# Patient Record
Sex: Female | Born: 1975 | Race: White | Hispanic: No | Marital: Married | Smoking: Never smoker
Health system: Southern US, Community
[De-identification: ages and names within clinical notes are randomized; demographics above are authoritative.]

## PROBLEM LIST (undated history)

## (undated) DIAGNOSIS — E039 Hypothyroidism, unspecified: Secondary | ICD-10-CM

## (undated) DIAGNOSIS — M069 Rheumatoid arthritis, unspecified: Secondary | ICD-10-CM

## (undated) DIAGNOSIS — N200 Calculus of kidney: Secondary | ICD-10-CM

## (undated) HISTORY — PX: ABDOMINAL HYSTERECTOMY: SHX81

## (undated) HISTORY — PX: APPENDECTOMY: SHX54

## (undated) HISTORY — PX: OVARIAN CYST SURGERY: SHX726

---

## 2012-01-02 ENCOUNTER — Emergency Department: Payer: Self-pay | Admitting: Internal Medicine

## 2012-03-03 ENCOUNTER — Emergency Department: Payer: Self-pay | Admitting: Emergency Medicine

## 2012-07-24 ENCOUNTER — Emergency Department: Payer: Self-pay | Admitting: Emergency Medicine

## 2012-08-05 ENCOUNTER — Emergency Department (HOSPITAL_COMMUNITY)
Admission: EM | Admit: 2012-08-05 | Discharge: 2012-08-05 | Disposition: A | Discharge status: Home / Self Care | Payer: Self-pay | Attending: Emergency Medicine | Admitting: Emergency Medicine

## 2012-08-05 ENCOUNTER — Encounter (HOSPITAL_COMMUNITY): Payer: Self-pay

## 2012-08-05 DIAGNOSIS — E039 Hypothyroidism, unspecified: Secondary | ICD-10-CM | POA: Insufficient documentation

## 2012-08-05 DIAGNOSIS — M069 Rheumatoid arthritis, unspecified: Secondary | ICD-10-CM | POA: Insufficient documentation

## 2012-08-05 DIAGNOSIS — R109 Unspecified abdominal pain: Secondary | ICD-10-CM | POA: Insufficient documentation

## 2012-08-05 HISTORY — DX: Hypothyroidism, unspecified: E03.9

## 2012-08-05 HISTORY — DX: Rheumatoid arthritis, unspecified: M06.9

## 2012-08-05 LAB — BASIC METABOLIC PANEL
BUN: 8 mg/dL (ref 6–23)
Chloride: 104 mEq/L (ref 96–112)
GFR calc Af Amer: >90 mL/min (ref >90)
GFR calc non Af Amer: >90 mL/min (ref >90)
Glucose, Bld: 126 mg/dL — ABNORMAL HIGH (ref 70–99)
Potassium: 3.7 mEq/L (ref 3.5–5.1)
Sodium: 136 mEq/L (ref 135–145)

## 2012-08-05 LAB — URINE MICROSCOPIC-ADD ON

## 2012-08-05 LAB — CBC WITH DIFFERENTIAL/PLATELET
Basophils Relative: 0 % (ref 0–1)
Eosinophils Absolute: 0.3 10*3/uL (ref 0.0–0.7)
Hemoglobin: 13.5 g/dL (ref 12.0–15.0)
Lymphs Abs: 3 10*3/uL (ref 0.7–4.0)
MCH: 28 pg (ref 26.0–34.0)
Monocytes Relative: 5 % (ref 3–12)
Neutro Abs: 5.3 10*3/uL (ref 1.7–7.7)
Neutrophils Relative %: 59 % (ref 43–77)
Platelets: 325 10*3/uL (ref 150–400)
RBC: 4.83 MIL/uL (ref 3.87–5.11)
WBC: 9.1 10*3/uL (ref 4.0–10.5)

## 2012-08-05 LAB — WET PREP, GENITAL

## 2012-08-05 LAB — URINALYSIS, ROUTINE W REFLEX MICROSCOPIC
Nitrite: NEGATIVE
Specific Gravity, Urine: 1.025 (ref 1.005–1.030)
Urobilinogen, UA: 0.2 mg/dL (ref 0.0–1.0)
pH: 6 (ref 5.0–8.0)

## 2012-08-05 MED ORDER — NAPROXEN 500 MG PO TABS: 500.0000 mg | ORAL_TABLET | Freq: Two times a day (BID) | ORAL | Status: AC

## 2012-08-05 MED ORDER — OXYCODONE-ACETAMINOPHEN 5-325 MG PO TABS
1.0000 | ORAL_TABLET | ORAL | Status: AC | PRN
Start: 2012-08-05 — End: 2012-08-15

## 2012-08-05 MED ORDER — ONDANSETRON HCL 4 MG/2ML IJ SOLN
4.0000 mg | Freq: Once | INTRAMUSCULAR | Status: AC
  Administered 2012-08-05: 4 mg via INTRAVENOUS
  Filled 2012-08-05: qty 2

## 2012-08-05 MED ORDER — KETOROLAC TROMETHAMINE 30 MG/ML IJ SOLN
30.0000 mg | Freq: Once | INTRAMUSCULAR | Status: AC
  Administered 2012-08-05: 30 mg via INTRAVENOUS
  Filled 2012-08-05: qty 1

## 2012-08-05 MED ORDER — HYDROMORPHONE HCL PF 1 MG/ML IJ SOLN
1.0000 mg | Freq: Once | INTRAMUSCULAR | Status: AC
  Administered 2012-08-05: 1 mg via INTRAVENOUS

## 2012-08-05 MED ORDER — HYDROMORPHONE HCL PF 1 MG/ML IJ SOLN
1.0000 mg | Freq: Once | INTRAMUSCULAR | Status: AC
  Administered 2012-08-05: 1 mg via INTRAVENOUS
  Filled 2012-08-05: qty 1

## 2012-08-05 MED ORDER — HYDROMORPHONE HCL PF 1 MG/ML IJ SOLN
INTRAMUSCULAR | Status: AC
  Filled 2012-08-05: qty 1

## 2012-08-05 NOTE — ED Notes (Signed)
Ambulatory in department to restroom with steady gait, does not appear to be in any pain, no acute distress noted.

## 2012-08-05 NOTE — ED Notes (Signed)
Sitting quietly in bed, does not appear to be in acute distress, converses easily.

## 2012-08-05 NOTE — ED Notes (Signed)
Patient states that she is having right lower abdominal pain, states that she has had ovarian torsion in the past and this feels the same.  States she has had a hysterectomy but the right ovary remains.  States she has had multiple ovarian surgeries in the past.

## 2012-08-05 NOTE — ED Notes (Signed)
Pt c/o "sharp throbbing"  Pain to right lower abd pain and states she is also swollen in vaginal area

## 2012-08-05 NOTE — ED Notes (Signed)
When discussing ultrasound in am, pt states she is not returning to [facility], she is instead going to [facility], states "do not schedule the ultrasound."

## 2012-08-05 NOTE — ED Provider Notes (Signed)
History     CSN: [id number]  Arrival date & time 08/05/12  0030   First MD Initiated Contact with Patient 08/05/12 0044      Chief Complaint  Patient presents with  . Abdominal Pain    (Consider location/radiation/quality/duration/timing/severity/associated sxs/prior treatment) HPI Comments: Pt with hx of recurrent ovarian cysts in the past, presents with right lower cautery and abdominal pain which is located from the suprapubic area to the right side. This has been persistent for 2 days, sharp and throbbing in nature and associated with nausea. Worse with palpation and movement. There is no associated dysuria, diarrhea, change in bowel habits, vaginal bleeding or discharge. She does admit to having some swollen vagina. She recently finished a course of prednisone for rheumatoid arthritis swelling of her left hand which improved slightly.  She has no other history of abdominal surgery  Patient is a 36 y.o. female presenting with abdominal pain. The history is provided by the patient and the spouse.  Abdominal Pain The primary symptoms of the illness include abdominal pain and nausea. The primary symptoms of the illness do not include fever, fatigue, shortness of breath, vomiting, diarrhea, hematemesis, hematochezia, dysuria, vaginal discharge or vaginal bleeding.    Past Medical History  Diagnosis Date  . Hypothyroid   . Rheumatoid arthritis     Past Surgical History  Procedure Date  . Ovarian cyst surgery   . Abdominal hysterectomy     No family history on file.  History  Substance Use Topics  . Smoking status: Never Smoker   . Smokeless tobacco: Not on file  . Alcohol Use: No    OB History    Grav Para Term Preterm Abortions TAB SAB Ect Mult Living                  Review of Systems  Constitutional: Negative for fever and fatigue.  Respiratory: Negative for shortness of breath.   Gastrointestinal: Positive for nausea and abdominal pain. Negative for vomiting,  diarrhea, hematochezia and hematemesis.  Genitourinary: Negative for dysuria, vaginal bleeding and vaginal discharge.  All other systems reviewed and are negative.    Allergies  Lisinopril; Phenergan; and Sulfa antibiotics  Home Medications   Current Outpatient Rx  Name Route Sig Dispense Refill  . NAPROXEN 500 MG PO TABS Oral Take 1 tablet (500 mg total) by mouth 2 (two) times daily with a meal. 30 tablet 0  . OXYCODONE-ACETAMINOPHEN 5-325 MG PO TABS Oral Take 1 tablet by mouth every 4 (four) hours as needed for pain. 20 tablet 0    BP 136/91  Pulse 84  Temp 98.3 F (36.8 C) (Oral)  Resp 18  Physical Exam  Nursing note and vitals reviewed. Constitutional: She appears well-developed and well-nourished. No distress.  HENT:  Head: Normocephalic and atraumatic.  Mouth/Throat: Oropharynx is clear and moist. No oropharyngeal exudate.  Eyes: Conjunctivae and EOM are normal. Pupils are equal, round, and reactive to light. Right eye exhibits no discharge. Left eye exhibits no discharge. No scleral icterus.  Neck: Normal range of motion. Neck supple. No JVD present. No thyromegaly present.  Cardiovascular: Normal rate, regular rhythm, normal heart sounds and intact distal pulses.  Exam reveals no gallop and no friction rub.   No murmur heard. Pulmonary/Chest: Effort normal and breath sounds normal. No respiratory distress. She has no wheezes. She has no rales.  Abdominal: Soft. Bowel sounds are normal. She exhibits no distension and no mass. There is tenderness ( Suprapubic and right lower  quadrant tenderness without guarding. No peritoneal signs, no other tenderness, very soft abdomen).  Musculoskeletal: Normal range of motion. She exhibits no edema and no tenderness.  Lymphadenopathy:    She has no cervical adenopathy.  Neurological: She is alert. Coordination normal.  Skin: Skin is warm and dry. No rash noted. No erythema.  Psychiatric: She has a normal mood and affect. Her behavior  is normal.    ED Course  Procedures (including critical care time)  Labs Reviewed  URINALYSIS, ROUTINE W REFLEX MICROSCOPIC - Abnormal; Notable for the following:    Leukocytes, UA TRACE (*)     All other components within normal limits  BASIC METABOLIC PANEL - Abnormal; Notable for the following:    Glucose, Bld 126 (*)     All other components within normal limits  URINE MICROSCOPIC-ADD ON - Abnormal; Notable for the following:    Squamous Epithelial / LPF FEW (*)     All other components within normal limits  WET PREP, GENITAL - Abnormal; Notable for the following:    Clue Cells Wet Prep HPF POC FEW (*)     WBC, Wet Prep HPF POC FEW (*)     All other components within normal limits  CBC WITH DIFFERENTIAL  PREGNANCY, URINE  GC/CHLAMYDIA PROBE AMP, GENITAL   No results found.   1. Abdominal pain       MDM  I have reevaluated the patient, she has abdominal pain which is persistent to the right lower quadrant. I've explained to her that she does not have a leukocytosis but that she is at risk for appendicitis given the location of her pain. I have encouraged a CT scan but the patient has declined and prefers an ultrasound in the morning to evaluate for ovarian cyst. She will return in the morning for the study and will return to the hospital should her pain worsen. I have given her intravascular Toradol, intravascular hydromorphone and she has had some improvement in her pain.  Discharge Prescriptions include:  Naprosyn Percocet         Vida Roller, MD 08/05/12 [phone]

## 2012-11-13 ENCOUNTER — Emergency Department: Payer: Self-pay | Admitting: Emergency Medicine

## 2012-11-13 LAB — COMPREHENSIVE METABOLIC PANEL
Alkaline Phosphatase: 87 U/L (ref 50–136)
BUN: 11 mg/dL (ref 7–18)
Bilirubin,Total: 0.4 mg/dL (ref 0.2–1.0)
Creatinine: 0.79 mg/dL (ref 0.60–1.30)
EGFR (Non-African Amer.): >60
SGPT (ALT): 22 U/L (ref 12–78)
Total Protein: 7 g/dL (ref 6.4–8.2)

## 2012-11-13 LAB — URINALYSIS, COMPLETE
Bacteria: NONE SEEN
Bilirubin,UR: NEGATIVE
Blood: NEGATIVE
Ketone: NEGATIVE
Ph: 7 (ref 4.5–8.0)
Protein: NEGATIVE
RBC,UR: NONE SEEN /HPF (ref 0–5)
Specific Gravity: 1.02 (ref 1.003–1.030)
Squamous Epithelial: 6
WBC UR: 1 /HPF (ref 0–5)

## 2012-11-13 LAB — DRUG SCREEN, URINE
Barbiturates, Ur Screen: NEGATIVE (ref ?–200)
Cannabinoid 50 Ng, Ur ~~LOC~~: NEGATIVE (ref ?–50)
Cocaine Metabolite,Ur ~~LOC~~: NEGATIVE (ref ?–300)
MDMA (Ecstasy)Ur Screen: NEGATIVE (ref ?–500)
Opiate, Ur Screen: NEGATIVE (ref ?–300)
Phencyclidine (PCP) Ur S: NEGATIVE (ref ?–25)

## 2012-11-13 LAB — CBC
HGB: 13 g/dL (ref 12.0–16.0)
MCH: 28 pg (ref 26.0–34.0)
Platelet: 310 10*3/uL (ref 150–440)

## 2012-11-13 LAB — TSH: Thyroid Stimulating Horm: 3.11 u[IU]/mL

## 2012-11-13 LAB — ETHANOL: Ethanol %: <0.003 % (ref 0.000–0.080)

## 2012-12-03 ENCOUNTER — Inpatient Hospital Stay: Payer: Self-pay | Admitting: Internal Medicine

## 2012-12-03 LAB — URINALYSIS, COMPLETE
Bilirubin,UR: NEGATIVE
Blood: NEGATIVE
Glucose,UR: NEGATIVE mg/dL (ref 0–75)
Hyaline Cast: 3
Leukocyte Esterase: NEGATIVE
Ph: 5 (ref 4.5–8.0)
Protein: NEGATIVE
WBC UR: 3 /HPF (ref 0–5)

## 2012-12-03 LAB — CBC WITH DIFFERENTIAL/PLATELET
Basophil #: 0 10*3/uL (ref 0.0–0.1)
Lymphocyte #: 1.7 10*3/uL (ref 1.0–3.6)
Lymphocyte %: 21.4 %
MCHC: 33 g/dL (ref 32.0–36.0)
MCV: 84 fL (ref 80–100)
Monocyte #: 0.4 x10 3/mm (ref 0.2–0.9)
Platelet: 266 10*3/uL (ref 150–440)
RDW: 13.7 % (ref 11.5–14.5)
WBC: 7.9 10*3/uL (ref 3.6–11.0)

## 2012-12-03 LAB — DRUG SCREEN, URINE
Benzodiazepine, Ur Scrn: NEGATIVE (ref ?–200)
Cannabinoid 50 Ng, Ur ~~LOC~~: NEGATIVE (ref ?–50)
MDMA (Ecstasy)Ur Screen: NEGATIVE (ref ?–500)
Methadone, Ur Screen: NEGATIVE (ref ?–300)
Phencyclidine (PCP) Ur S: NEGATIVE (ref ?–25)

## 2012-12-03 LAB — CSF CELL COUNT WITH DIFFERENTIAL
CSF Tube #: 1
Eosinophil: 0 %
Eosinophil: 0 %
Lymphocytes: 80 %
Monocytes/Macrophages: 12 %
Monocytes/Macrophages: 26 %
Neutrophils: 0 %
Neutrophils: 8 %
RBC (CSF): 14 /mm3
WBC (CSF): 24 /mm3
WBC (CSF): 7 /mm3

## 2012-12-03 LAB — COMPREHENSIVE METABOLIC PANEL
Albumin: 3.6 g/dL (ref 3.4–5.0)
BUN: 11 mg/dL (ref 7–18)
Chloride: 108 mmol/L — ABNORMAL HIGH (ref 98–107)
Creatinine: 0.8 mg/dL (ref 0.60–1.30)
Glucose: 105 mg/dL — ABNORMAL HIGH (ref 65–99)
Potassium: 3.5 mmol/L (ref 3.5–5.1)
SGOT(AST): 22 U/L (ref 15–37)
SGPT (ALT): 27 U/L (ref 12–78)

## 2012-12-03 LAB — TROPONIN I: Troponin-I: <0.02 ng/mL

## 2012-12-03 LAB — TSH: Thyroid Stimulating Horm: 3.62 u[IU]/mL

## 2012-12-04 LAB — CBC WITH DIFFERENTIAL/PLATELET
Basophil %: 0.8 %
Eosinophil %: 2.3 %
HGB: 12.5 g/dL (ref 12.0–16.0)
Lymphocyte %: 25.5 %
Monocyte #: 0.4 x10 3/mm (ref 0.2–0.9)
Monocyte %: 5.8 %
Neutrophil %: 65.6 %
Platelet: 280 10*3/uL (ref 150–440)
RBC: 4.47 10*6/uL (ref 3.80–5.20)
WBC: 7.4 10*3/uL (ref 3.6–11.0)

## 2012-12-04 LAB — LIPID PANEL
Cholesterol: 160 mg/dL (ref 0–200)
HDL Cholesterol: 23 mg/dL — ABNORMAL LOW (ref 40–60)
VLDL Cholesterol, Calc: 30 mg/dL (ref 5–40)

## 2012-12-04 LAB — BASIC METABOLIC PANEL
Calcium, Total: 8.3 mg/dL — ABNORMAL LOW (ref 8.5–10.1)
Chloride: 110 mmol/L — ABNORMAL HIGH (ref 98–107)
EGFR (African American): >60
Glucose: 93 mg/dL (ref 65–99)
Osmolality: 280 (ref 275–301)
Potassium: 4.1 mmol/L (ref 3.5–5.1)

## 2012-12-06 LAB — CSF CULTURE W GRAM STAIN

## 2012-12-27 ENCOUNTER — Inpatient Hospital Stay: Payer: Self-pay | Admitting: Psychiatry

## 2012-12-27 LAB — COMPREHENSIVE METABOLIC PANEL
Albumin: 4 g/dL (ref 3.4–5.0)
Anion Gap: 6 — ABNORMAL LOW (ref 7–16)
Bilirubin,Total: 0.7 mg/dL (ref 0.2–1.0)
Calcium, Total: 8.9 mg/dL (ref 8.5–10.1)
Creatinine: 0.88 mg/dL (ref 0.60–1.30)
Osmolality: 275 (ref 275–301)
Potassium: 3.9 mmol/L (ref 3.5–5.1)
Sodium: 138 mmol/L (ref 136–145)
Total Protein: 7.4 g/dL (ref 6.4–8.2)

## 2012-12-27 LAB — CBC
MCV: 83 fL (ref 80–100)
Platelet: 326 10*3/uL (ref 150–440)
RBC: 4.95 10*6/uL (ref 3.80–5.20)
RDW: 13.8 % (ref 11.5–14.5)
WBC: 7.8 10*3/uL (ref 3.6–11.0)

## 2012-12-27 LAB — URINALYSIS, COMPLETE
Bacteria: NONE SEEN
Blood: NEGATIVE
Nitrite: NEGATIVE
Ph: 5 (ref 4.5–8.0)
Protein: NEGATIVE
Specific Gravity: 1.024 (ref 1.003–1.030)
WBC UR: 1 /HPF (ref 0–5)

## 2012-12-27 LAB — DRUG SCREEN, URINE
Barbiturates, Ur Screen: NEGATIVE (ref ?–200)
Cannabinoid 50 Ng, Ur ~~LOC~~: NEGATIVE (ref ?–50)
MDMA (Ecstasy)Ur Screen: NEGATIVE (ref ?–500)
Methadone, Ur Screen: NEGATIVE (ref ?–300)
Opiate, Ur Screen: NEGATIVE (ref ?–300)
Tricyclic, Ur Screen: NEGATIVE (ref ?–1000)

## 2012-12-27 LAB — SALICYLATE LEVEL: Salicylates, Serum: <1.7 mg/dL

## 2012-12-27 LAB — ETHANOL: Ethanol: <3 mg/dL

## 2013-02-01 ENCOUNTER — Emergency Department: Payer: Self-pay | Admitting: Emergency Medicine

## 2013-02-01 LAB — URINALYSIS, COMPLETE
Blood: NEGATIVE
Leukocyte Esterase: NEGATIVE
Ph: 6 (ref 4.5–8.0)
Specific Gravity: 1.035 (ref 1.003–1.030)
Squamous Epithelial: 2
WBC UR: NONE SEEN /HPF (ref 0–5)

## 2013-02-01 LAB — CBC
HCT: 44.4 % (ref 35.0–47.0)
HGB: 14.9 g/dL (ref 12.0–16.0)
MCH: 27.9 pg (ref 26.0–34.0)
MCHC: 33.6 g/dL (ref 32.0–36.0)
Platelet: 389 10*3/uL (ref 150–440)
RBC: 5.35 10*6/uL — ABNORMAL HIGH (ref 3.80–5.20)
WBC: 11.9 10*3/uL — ABNORMAL HIGH (ref 3.6–11.0)

## 2013-02-01 LAB — COMPREHENSIVE METABOLIC PANEL
Albumin: 4.4 g/dL (ref 3.4–5.0)
Alkaline Phosphatase: 102 U/L (ref 50–136)
Anion Gap: 8 (ref 7–16)
BUN: 12 mg/dL (ref 7–18)
Calcium, Total: 9.5 mg/dL (ref 8.5–10.1)
Co2: 25 mmol/L (ref 21–32)
Creatinine: 0.72 mg/dL (ref 0.60–1.30)
EGFR (African American): >60
EGFR (Non-African Amer.): >60
Glucose: 98 mg/dL (ref 65–99)
Osmolality: 275 (ref 275–301)
SGOT(AST): 13 U/L — ABNORMAL LOW (ref 15–37)
SGPT (ALT): 21 U/L (ref 12–78)
Total Protein: 8 g/dL (ref 6.4–8.2)

## 2013-02-01 LAB — DRUG SCREEN, URINE
Cannabinoid 50 Ng, Ur ~~LOC~~: NEGATIVE (ref ?–50)
Cocaine Metabolite,Ur ~~LOC~~: NEGATIVE (ref ?–300)
Opiate, Ur Screen: NEGATIVE (ref ?–300)
Tricyclic, Ur Screen: NEGATIVE (ref ?–1000)

## 2013-07-14 ENCOUNTER — Emergency Department: Payer: Self-pay | Admitting: Emergency Medicine

## 2013-07-15 LAB — BASIC METABOLIC PANEL
Anion Gap: 10 (ref 7–16)
BUN: 14 mg/dL (ref 7–18)
Chloride: 104 mmol/L (ref 98–107)
Co2: 24 mmol/L (ref 21–32)
Creatinine: 1.13 mg/dL (ref 0.60–1.30)
EGFR (African American): >60
Glucose: 98 mg/dL (ref 65–99)
Osmolality: 276 (ref 275–301)
Potassium: 3.6 mmol/L (ref 3.5–5.1)
Sodium: 138 mmol/L (ref 136–145)

## 2013-07-15 LAB — URINALYSIS, COMPLETE
Bacteria: NONE SEEN
Bilirubin,UR: NEGATIVE
Blood: NEGATIVE
Glucose,UR: NEGATIVE mg/dL (ref 0–75)
Leukocyte Esterase: NEGATIVE
Nitrite: NEGATIVE
Ph: 7 (ref 4.5–8.0)
RBC,UR: NONE SEEN /HPF (ref 0–5)
Specific Gravity: 1.013 (ref 1.003–1.030)
Squamous Epithelial: NONE SEEN
WBC UR: <1 /HPF (ref 0–5)

## 2013-07-15 LAB — CBC
HGB: 13.1 g/dL (ref 12.0–16.0)
MCH: 28.1 pg (ref 26.0–34.0)
MCHC: 34.6 g/dL (ref 32.0–36.0)
Platelet: 366 10*3/uL (ref 150–440)
RBC: 4.66 10*6/uL (ref 3.80–5.20)
RDW: 13.6 % (ref 11.5–14.5)

## 2013-07-19 DIAGNOSIS — E039 Hypothyroidism, unspecified: Secondary | ICD-10-CM | POA: Diagnosis present

## 2013-09-22 ENCOUNTER — Emergency Department: Payer: Self-pay | Admitting: Emergency Medicine

## 2013-12-21 ENCOUNTER — Emergency Department: Payer: Self-pay | Admitting: Emergency Medicine

## 2013-12-21 LAB — URINALYSIS, COMPLETE
Bilirubin,UR: NEGATIVE
Blood: NEGATIVE
Glucose,UR: NEGATIVE mg/dL (ref 0–75)
Ketone: NEGATIVE
Nitrite: NEGATIVE
Ph: 5 (ref 4.5–8.0)
RBC,UR: 1 /HPF (ref 0–5)
Specific Gravity: 1.024 (ref 1.003–1.030)
WBC UR: 1 /HPF (ref 0–5)

## 2013-12-21 LAB — BASIC METABOLIC PANEL
Calcium, Total: 9.1 mg/dL (ref 8.5–10.1)
Chloride: 108 mmol/L — ABNORMAL HIGH (ref 98–107)
Co2: 23 mmol/L (ref 21–32)
Creatinine: 0.82 mg/dL (ref 0.60–1.30)
EGFR (African American): >60
EGFR (Non-African Amer.): >60
Potassium: 3.9 mmol/L (ref 3.5–5.1)

## 2013-12-30 IMAGING — CR LEFT MIDDLE FINGER 2+V
1 series · 3 of 3 positions shown · non-contrast
Comparison: none

REASON FOR EXAM: pain RA
COMMENTS:

[Series 1: x finger pa left · 0.14mm/px · 3 of 3 slices shown]
[im 1/3]
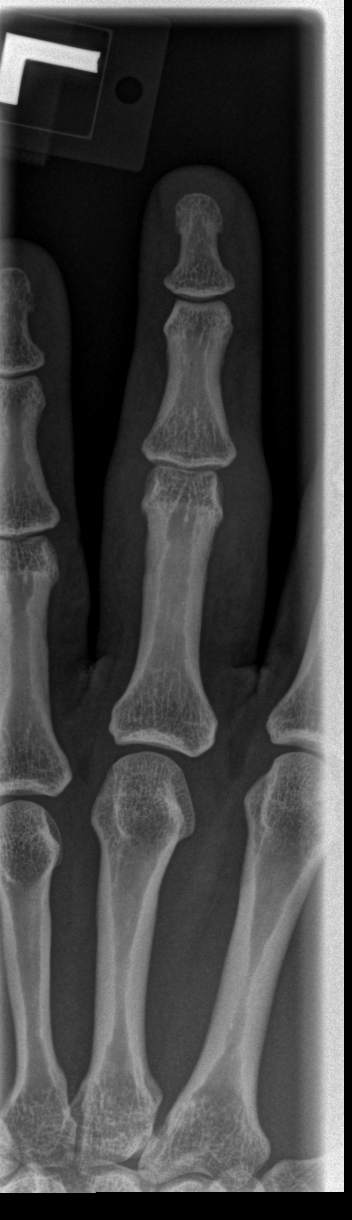
[im 2/3]
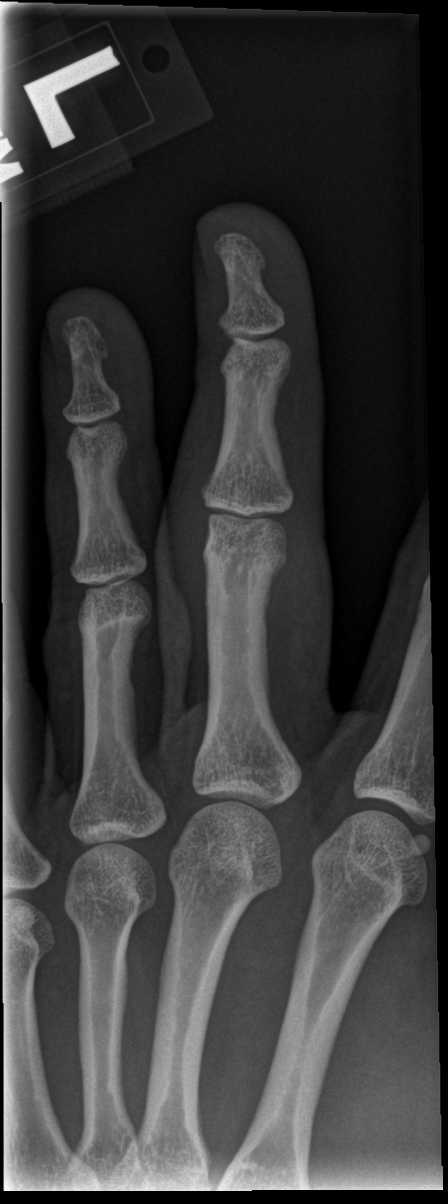
[im 3/3]
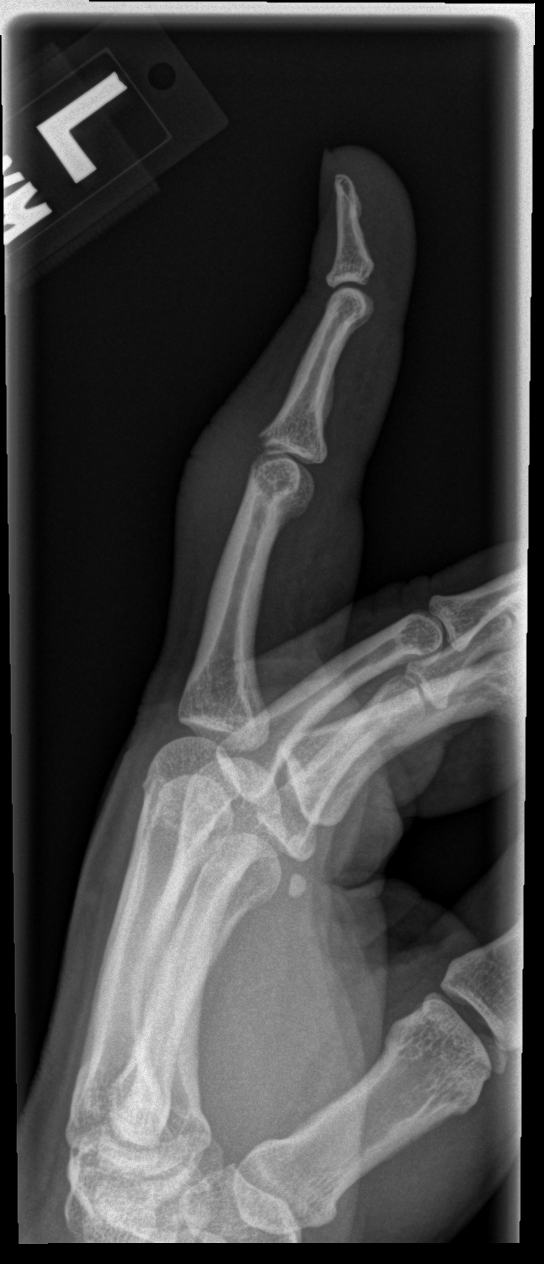

[3 of 3 positions shown; findings below may reference images not displayed]

PROCEDURE:     DXR - DXR FINGER MID 3RD DIGIT LT HAND  - January 02, 2012  [DATE]

RESULT:     And the lateral view there is deformity of the posterior dorsal
aspect of the middle phalanx at the PIP joint. The findings are suspicious
for a fracture of the proximal portion of the middle phalanx. There is
associated soft tissue swelling in this region. The finding however is seen
in only one view and the detached fragment is not evident. Conceivably this
could represent a superiors finding.
IMPRESSION: 1. Probable minimally displaced fracture of the proximal dorsal aspect of
the middle phalanx at the DIP joint. As noted above the finding is seen in
only one view.

## 2014-02-09 ENCOUNTER — Emergency Department: Payer: Self-pay | Admitting: Emergency Medicine

## 2014-02-12 LAB — BETA STREP CULTURE(ARMC)

## 2014-05-05 ENCOUNTER — Emergency Department: Payer: Self-pay | Admitting: Emergency Medicine

## 2014-05-05 LAB — URINALYSIS, COMPLETE
BACTERIA: NONE SEEN
Bilirubin,UR: NEGATIVE
Blood: NEGATIVE
Glucose,UR: NEGATIVE mg/dL (ref 0–75)
KETONE: NEGATIVE
Nitrite: NEGATIVE
Ph: 5 (ref 4.5–8.0)
Protein: NEGATIVE
Specific Gravity: 1.025 (ref 1.003–1.030)
WBC UR: 1 /HPF (ref 0–5)

## 2014-05-05 LAB — CBC
HCT: 40.2 % (ref 35.0–47.0)
HGB: 13.7 g/dL (ref 12.0–16.0)
MCH: 28.3 pg (ref 26.0–34.0)
MCHC: 33.9 g/dL (ref 32.0–36.0)
MCV: 83 fL (ref 80–100)
Platelet: 286 10*3/uL (ref 150–440)
RBC: 4.82 10*6/uL (ref 3.80–5.20)
RDW: 14.1 % (ref 11.5–14.5)
WBC: 8 10*3/uL (ref 3.6–11.0)

## 2014-05-05 LAB — COMPREHENSIVE METABOLIC PANEL
ALK PHOS: 80 U/L
Albumin: 3.4 g/dL (ref 3.4–5.0)
Anion Gap: 5 — ABNORMAL LOW (ref 7–16)
BILIRUBIN TOTAL: 0.6 mg/dL (ref 0.2–1.0)
BUN: 10 mg/dL (ref 7–18)
CALCIUM: 8.9 mg/dL (ref 8.5–10.1)
CREATININE: 0.63 mg/dL (ref 0.60–1.30)
Chloride: 108 mmol/L — ABNORMAL HIGH (ref 98–107)
Co2: 23 mmol/L (ref 21–32)
EGFR (African American): >60
EGFR (Non-African Amer.): >60
GLUCOSE: 125 mg/dL — AB (ref 65–99)
Osmolality: 272 (ref 275–301)
Potassium: 4 mmol/L (ref 3.5–5.1)
SGOT(AST): 20 U/L (ref 15–37)
SGPT (ALT): 27 U/L (ref 12–78)
Sodium: 136 mmol/L (ref 136–145)
TOTAL PROTEIN: 7.2 g/dL (ref 6.4–8.2)

## 2014-09-22 ENCOUNTER — Emergency Department: Payer: Self-pay | Admitting: Emergency Medicine

## 2014-09-22 LAB — CBC
HCT: 41.8 % (ref 35.0–47.0)
HGB: 13.9 g/dL (ref 12.0–16.0)
MCH: 27.4 pg (ref 26.0–34.0)
MCHC: 33.2 g/dL (ref 32.0–36.0)
MCV: 82 fL (ref 80–100)
PLATELETS: 292 10*3/uL (ref 150–440)
RBC: 5.07 10*6/uL (ref 3.80–5.20)
RDW: 13.9 % (ref 11.5–14.5)
WBC: 7.4 10*3/uL (ref 3.6–11.0)

## 2014-09-22 LAB — COMPREHENSIVE METABOLIC PANEL
ALK PHOS: 72 U/L
Albumin: 3.4 g/dL (ref 3.4–5.0)
Anion Gap: 5 — ABNORMAL LOW (ref 7–16)
BILIRUBIN TOTAL: 1 mg/dL (ref 0.2–1.0)
BUN: 8 mg/dL (ref 7–18)
CHLORIDE: 108 mmol/L — AB (ref 98–107)
Calcium, Total: 8.5 mg/dL (ref 8.5–10.1)
Co2: 26 mmol/L (ref 21–32)
Creatinine: 0.84 mg/dL (ref 0.60–1.30)
EGFR (African American): >60
GLUCOSE: 114 mg/dL — AB (ref 65–99)
OSMOLALITY: 277 (ref 275–301)
POTASSIUM: 3.6 mmol/L (ref 3.5–5.1)
SGOT(AST): 18 U/L (ref 15–37)
SGPT (ALT): 25 U/L
SODIUM: 139 mmol/L (ref 136–145)
Total Protein: 7.1 g/dL (ref 6.4–8.2)

## 2014-09-22 LAB — TROPONIN I: Troponin-I: <0.02 ng/mL

## 2014-12-24 ENCOUNTER — Emergency Department: Payer: Self-pay | Admitting: Emergency Medicine

## 2014-12-24 LAB — BASIC METABOLIC PANEL
Anion Gap: 10 (ref 7–16)
BUN: 10 mg/dL (ref 7–18)
CO2: 21 mmol/L (ref 21–32)
Calcium, Total: 8.6 mg/dL (ref 8.5–10.1)
Chloride: 110 mmol/L — ABNORMAL HIGH (ref 98–107)
Creatinine: 0.93 mg/dL (ref 0.60–1.30)
EGFR (African American): >60
EGFR (Non-African Amer.): >60
Glucose: 106 mg/dL — ABNORMAL HIGH (ref 65–99)
OSMOLALITY: 281 (ref 275–301)
Potassium: 3.5 mmol/L (ref 3.5–5.1)
SODIUM: 141 mmol/L (ref 136–145)

## 2014-12-24 LAB — CBC
HCT: 40.1 % (ref 35.0–47.0)
HGB: 13.1 g/dL (ref 12.0–16.0)
MCH: 27.1 pg (ref 26.0–34.0)
MCHC: 32.8 g/dL (ref 32.0–36.0)
MCV: 83 fL (ref 80–100)
Platelet: 312 10*3/uL (ref 150–440)
RBC: 4.86 10*6/uL (ref 3.80–5.20)
RDW: 13.8 % (ref 11.5–14.5)
WBC: 8 10*3/uL (ref 3.6–11.0)

## 2014-12-24 LAB — TROPONIN I

## 2014-12-25 ENCOUNTER — Emergency Department: Payer: Self-pay | Admitting: Emergency Medicine

## 2014-12-26 LAB — URINALYSIS, COMPLETE
BILIRUBIN, UR: NEGATIVE
Blood: NEGATIVE
Glucose,UR: NEGATIVE mg/dL (ref 0–75)
Ketone: NEGATIVE
LEUKOCYTE ESTERASE: NEGATIVE
NITRITE: NEGATIVE
Ph: 5 (ref 4.5–8.0)
Protein: NEGATIVE
RBC,UR: 1 /HPF (ref 0–5)
SPECIFIC GRAVITY: 1.026 (ref 1.003–1.030)
Squamous Epithelial: 11

## 2015-04-18 NOTE — H&P (Signed)
PATIENT NAME:  Nichole Fuller, Nichole Fuller MR#:  [id number] DATE OF BIRTH:  [birth year]  DATE OF ADMISSION:  12/03/2012   PRIMARY CARE PHYSICIAN: [place]   HISTORY OF PRESENTING ILLNESS: This is a 39 year old female with past medical history of hypothyroidism and ovarian cyst who came to the Emergency Room because she was working today at her workplace. She works at [employer]. She suddenly felt very dizzy and weak and she told the person standing nearby that she was not feeling well and then she suddenly passed out. The next thing she noticed was she was lying on the floor and people around her but she does not remember anybody mentioning she had any seizures any time while she passed out. After regaining her consciousness now she doesn't feel any weakness focally in any limbs. She is having generalized weakness overall. She said that since the last 3 to 4 days she had headaches coming on and off and she had some memory problems also for the last 2 to 3 days. She had low-grade fever also associated with it. Denies any nausea, vomiting, any change in vision, or any change in hearing. She has a history of auditory hallucinations and she said that she had worsening of her hallucinations recently and she went to her doctor and they changed the medication and after that she is feeling fine now.   REVIEW OF SYSTEMS: Positive for weakness. Negative for any weight loss or weight gain. EYES: Denies any blurring or double lesion, any pain or redness or discharge from the eyes. ENT: Denies any tinnitus, ear pain, hearing loss, or any discharge from the ears or postnasal drip. RESPIRATORY: Denies any cough, wheezing, hemoptysis, or any respiratory problem. CARDIOVASCULAR: Denies any chest pain, orthopnea, palpitation, or arrhythmia. GASTROINTESTINAL: Denies any nausea, vomiting, diarrhea, abdominal pain, or change in bowel habits. GENITOURINARY: Denies dysuria, hematuria, frequency, or incontinence of the urine. ENDOCRINE:  Denies polyuria or nocturia. Denies any heat or cold intolerance. SKIN: Denies any rashes or lesions in the skin. MUSCULOSKELETAL: Denies any pain or swelling in the joints. NEUROLOGICAL: Denies any numbness, any focal weakness, tremors, seizures. PSYCHIATRIC: Denies any anxiety, insomnia but she said that she has auditory hallucinations and she is being treated for that and it is under control right now.   PAST MEDICAL HISTORY: 1. Auditory hallucinations. 2. Hypothyroidism. 3. Ovarian cyst and chronic pain because of that.   PAST SURGICAL HISTORY:  1. Left-sided oophorectomy. 2. Right-sided ovarian cystectomy.  ALLERGIES: Allergic to lisinopril, Phenergan, and sulfa drugs.   SOCIAL HISTORY: Denies smoking, drugs, or alcohol use. Works at [employer].   FAMILY HISTORY:  Positive for cancer in multiple family members. Diabetes in father.   PHYSICAL EXAMINATION:   VITAL SIGNS: Temperature 97.9, pulse rate 91, respirations 18, blood pressure 109/64, saturation 96 on room air.   GENERAL: She is fully alert, oriented to time, place, and person and right now cooperative with history taking and physical examination.   HEENT: Atraumatic. Conjunctivae pink. Oral mucosa moist. Hearing grossly intact.   NECK: Supple. No neck rigidity. No JVD.   RESPIRATORY: Bilateral clear and equal air entry. No crackles or rhonchi heard.   CARDIOVASCULAR: S1, S2 present, regular. No murmur appreciated.   ABDOMEN: Soft, nontender. Bowel sounds present. No organomegaly appreciated. Obesity present.   EXTREMITIES: Legs bilateral no edema.   SKIN: No rashes.   NEUROLOGICAL: No tremor. No rigidity. Grossly intact. No cranial nerve deficit appreciated. Power 5 out of 5 in all four limbs.  LABORATORY, DIAGNOSTIC, AND RADIOLOGICAL DATA: Glucose 105, BUN 11, creatinine 0.8, sodium 138, potassium 3.5, chloride 108, CO2 24, calcium 8.4, total protein 6.8, albumin 3.6, bilirubin 0.7, alkaline phosphatase 91, SGOT  22, SGPT 27. Troponin less than 0.02. TSH 3.62. Urine for drugs is negative. Total WBC 7.9, RBC 4.7, hemoglobin 12.9, platelet count 266, MCV 84. CSF analysis clarity clear, RBC 0, WBC in one tube 7, the other 24, neutrophils 0, lymphocyte 80 in one tube and 74 in the other tube. Microbiology culture CSF no organisms seen. Urinalysis grossly negative.   CT of the head negative.   ASSESSMENT:  1. Meningitis, possibly viral or encephalitis. Will give Rocephin IV and keep her on contact isolation at this time. Will call ID consult. Will give Rocephin and acyclovir IV until the cultures are negative or ID wants to discontinue the coverage for bacterial meningitis also.  2. Hypothyroidism. I will give her levothyroxine. Continue the same dose as she is taking at home. 3. DVT and GI prophylaxis. 4. Insomnia. I will give her Benadryl to help her to sleep as she is taking at home.  5. Auditory hallucinations. I will continue her home medication, Risperdal and citalopram.   TOTAL TIME SPENT: 50 minutes.    CODE STATUS: FULL CODE.   ____________________________ Hope PigeonVaibhavkumar G. Elisabeth PigeonVachhani, MD vgv:drc D: 12/03/2012 21:12:43 ET T: 12/04/2012 06:25:37 ET JOB#: [id number]  cc: Hope PigeonVaibhavkumar G. Elisabeth PigeonVachhani, MD, <Dictator> Altamese DillingVAIBHAVKUMAR Jabe Jeanbaptiste MD ELECTRONICALLY SIGNED 12/14/2012 22:57

## 2015-04-18 NOTE — Consult Note (Signed)
PATIENT NAME:  Nichole Fuller, Merissa M MR#:  [id number] DATE OF BIRTH:  10/11/1976  DATE OF CONSULTATION:  11/14/2012  REFERRING PHYSICIAN:   CONSULTING PHYSICIAN:  Aamina Skiff K. Daira Hine, MD  SUBJECTIVE:  The patient is a 39 year old white female employed as a Conservation officer, naturecashier at [employer], and held the job since 03/2012.  The patient is married for the second time for one year and lives with her husband.  The patient comes to [facility] by the Emergency Room with the chief complaint of "hearing voices, which are getting worse and last night they got worse.  These voices are muffled and do not make any sense, but bother me  a lot because they are constantly there."  HISTORY OF PRESENT ILLNESS:  The patient reports that three weeks ago she started hearing voices that are muffled that she cannot identify, and she does not know what they are saying but this had been getting worse by last night.  She told her husband and she thought she should come here for help.    PAST PSYCHIATRIC HISTORY:  No previous history of inpatient hospitalization.  No history of suicide attempts.  Was given Lexapro when she was depressed after her first divorce and it helped her for a while.  She took counseling but quit as she felt better.  The patient reports lots of stresses in her life which include her two children from her first marriage live with their father and he does not let her see the children except once in one and a half years, and he makes her pay child support.  All these stressors added up for her to get depressed and she started hearing voices.  ALCOHOL AND DRUGS:  Denied.  MENTAL STATUS EXAMINATION:  The patient is dressed in hospital clothes, alert and oriented to place, person, and time.  Fully aware of the situation that brought her for admission to [facility] via the Emergency Room.  She knew the day, date, time, place, person, and situation.  Cognition was intact.   Affect is appropriate with mood, which is low and down, and depressed about the situation with her children and life in general.  Admits feeling hopeless and helpless at times. Denies any suicidal or homicidal ideas or plans.  Admits to hearing voices which are muffled which are being helped with a low dose of Haldol that was started last night.  Voices are not as bad and not as loud and she is able to rest better.  General knowledge information is fair.  She could spell the word "world" forward and backward without any problems.  Denies any ideas to hurt herself or others and contracts for safety.  Insight and judgment guarded.  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  PLAN:  Recommend discharge as the patient wants to go home and she contracts for safety and she is able to go home and get follow-up appointment with a psychiatrist and mental health agency on an outpatient basis.  Recommend that the patient should continue Celexa 40 mg p.o. daily and Haldol 1 mg p.o. b.i.d. to help her with her auditory hallucinations that have been bothering her.  The patient is recommended to make an appointment in the community for followup for counseling and medication management and she agrees to the same.    ____________________________ Jannet MantisSurya K. Guss Bundehalla, MD skc:bjt D: 11/14/2012 18:57:47 ET T: 11/15/2012 12:39:06 ET JOB#: [id number]  cc: Monika SalkSurya K. Guss Bundehalla, MD, <Dictator>  Beau Fanny MD ELECTRONICALLY SIGNED 11/15/2012 15:00

## 2015-04-18 NOTE — Consult Note (Signed)
PATIENT NAME:  Nichole Fuller, WOLLEN MR#:  [id number] DATE OF BIRTH:  1976/12/15  DATE OF CONSULTATION:  12/04/2012  REFERRING PHYSICIAN: Hope Pigeon. Elisabeth Pigeon, MD  CONSULTING PHYSICIAN: Rosalyn Gess. Mashell Sieben, M.D.   REASON FOR CONSULTATION: Meningitis.   HISTORY OF PRESENT ILLNESS: The patient is a 39 year old female with a past history significant for rheumatoid arthritis and recent psychosis diagnosis who was admitted yesterday with an approximately two week history of headaches and neck stiffness. She initially noted her headache in the frontal aspect bilaterally. She had some associated upper respiratory infection symptoms and attributed this all to that. She was at work and began feeling weak and dizzy. She then had a syncopal episode and was out for a few seconds to minutes. She had no witnessed seizure activity. She has had some fevers and chills associated with her headache. She was  recently diagnosed with psychotic disorder and was started on Risperdal but this was started after her headaches began. She has been taking ibuprofen for the headaches without significant relief. She was brought to the hospital and underwent lumbar puncture which demonstrated in tube 4, no red cells, 24 white cells, 74% lymphocytes, and 26% neutrophils. Her glucose was 54 and protein was 30. The Gram stain showed no organisms and few white cells and rare red cells. Her white count on admission was 7.9 and has remained normal.   ALLERGIES: Lisinopril, Phenergan and sulfa.   PAST MEDICAL HISTORY:  1. Rheumatoid arthritis. She was previously on therapy but has not been on therapy for several years  2. Psychotic disorder with auditory hallucinations. This is improved on Risperdal treatment. 3. Hypothyroidism.  4. Right ovarian cyst with chronic pelvic pain.  5. Vitiligo.  SOCIAL HISTORY: The patient lives at home with her husband and in-laws. She does not smoke. She does not drink. No injecting drug use history.    FAMILY HISTORY: Positive for cancer and diabetes.   REVIEW OF SYSTEMS:  GENERAL: Positive fevers, chills, malaise, and weakness.   HEENT: Positive headache, positive congestion in sinuses and nasally. Positive sore throat.   NECK: Some stiffness. No lymphadenopathy.   CHEST: Some cough with no productive sputum. No significant shortness of breath other than some shortness of breath with exertion.   CARDIAC: No chest pains. No palpitations. No peripheral edema.   GI: No nausea, no vomiting, no abdominal pain other than her chronic right pelvic pain related to her ovarian cyst. Positive diarrhea.   GU: No complaints.   MUSCULOSKELETAL: Generalized malaise but no focal arthritis.   SKIN: No rashes other than her chronic vitiligo.  NEUROLOGIC: No confusion. No focal weakness.   PSYCHIATRIC: Positive auditory hallucinations which have been improved with therapy.  All other systems are negative.  PHYSICAL EXAMINATION:  VITAL SIGNS: T-max 98.4, T-current 98.2, pulse 86, blood pressure 128/87, 97% on room air.   GENERAL: 39 year old obese white female in no acute distress.   HEENT: Normocephalic, atraumatic. Pupils equal, reactive to light. Extraocular motion intact. Sclerae, conjunctivae, and lids are without evidence for emboli or petechiae. Oropharynx shows no erythema or exudate. Teeth and gums are in good condition.   NECK: Midline trachea. No lymphadenopathy. No thyromegaly. She has some posterior neck pain with forward flexion.   RESPIRATORY: Clear to auscultation bilaterally with good air movement. No focal consolidation.   HEART: Regular rate and rhythm without murmur, rub, or gallop.   ABDOMEN: Soft, nontender, and nondistended. No hepatosplenomegaly. No hernia is noted.   EXTREMITIES: No evidence for tenosynovitis.  SKIN: She has  changes of vitiligo on both hands and in the shins bilaterally. No other rashes were noted. There were no stigmata of endocarditis,  specifically no Janeway lesions or Osler nodes.   NEUROLOGIC: The patient is awake and interactive, moving all four extremities. There was negative Kernig and Brudzinski sign.   PSYCHIATRIC: Mood and affect pleasant.   LABORATORY, DIAGNOSTIC, AND RADIOLOGICAL DATA: BUN 10, creatinine 0.70, bicarbonate 24, anion gap 7. LFTs on admission were  unremarkable. Tox screen was unremarkable. Thyroid,  TSH was normal. White count of 7.4 with a hemoglobin 12.5, platelet count 280, ANC 4.9. White count on admission was 7.9. CSF studies are noted in the history and physical. Urinalysis was unremarkable. A CT scan of the head without contrast showed no acute abnormalities. Chest x-ray was unremarkable. Fluoroscopy for lumbar puncture indicated opening pressure of 16 cm.   IMPRESSION: 39 year old female with a history of rheumatoid arthritis, admitted with aseptic meningitis.   RECOMMENDATIONS:  1. She has had symptoms for about two weeks. She was started on Risperdal recently, but this was after she began having headaches. Her exam is fairly benign.  2. There is no need to treat with acyclovir as she has mild disease and viral meningitis, including HSV meningitis, and resolves on its own.  3. I would discontinue her home on Ultracet for discomfort.  4. She was told that her headache could last for another week or so.  This is a low level infectious disease consult. Thank you very much for involving me in Ms. Dusenbery's care.     ____________________________ Rosalyn GessMichael E. Chiquitta Matty, MD meb:ljs D: 12/04/2012 16:56:13 ET T: 12/05/2012 09:41:32 ET JOB#: [id number]  cc: Rosalyn GessMichael E. Kaleeah Gingerich, MD, <Dictator> Vaibhavkumar G. Elisabeth PigeonVachhani, MD Abdurahman Rugg E Yuri Fana MD ELECTRONICALLY SIGNED 12/11/2012 10:17

## 2015-04-18 NOTE — Consult Note (Signed)
Impression:  39yo female w/ h/o RA admitted with aseptic meningitis.  She has had symptoms for about two weeks.  She was started on respirdol recently, but this was after she began having the headaches.  Her exam is fairly benign. There is no need to treat with acyclovir as she has mild disease and viral meningitis, including HSV meningitis, resolves on its own. Would d/c her home on ultracet for discomfort. She was told that her headache could last for another week or so.  Electronic Signatures: Chanley Mcenery, Rosalyn GessMichael E (MD)  (Signed on 06-Dec-13 16:47)  Authored  Last Updated: 06-Dec-13 16:47 by Milli Woolridge, Rosalyn GessMichael E (MD)

## 2015-04-21 NOTE — Discharge Summary (Signed)
PATIENT NAME:  Nichole Fuller, Nichole Fuller MR#:  [id number] DATE OF BIRTH:  04-Sep-1976  DATE OF ADMISSION:  12/03/2012 DATE OF DISCHARGE:  12/04/2012  DIAGNOSES:  1. Possible viral meningitis. 2. History of auditory hallucinations. 3. Hypothyroidism. 4. Ovarian cyst. 5. Insomnia.   DISPOSITION: Patient is being discharged home.   FOLLOWUP: Follow up with PCP in 1 to 2 weeks after discharge.   DIET: Regular.   ACTIVITY: As tolerated.   DISCHARGE MEDICATIONS:  1. Citalopram 40 mg daily.  2. Synthroid 50 mcg daily.  3. Medroxyprogesterone 5 mg daily.  4. Risperdal 3 mg b.i.d.  5. Benadryl 25 mg at bedtime. 6. Ultracet 325/37.5, 1 tablet q.4 hours p.r.n. pain.   CONSULTATION: ID consultation with Dr. Leavy CellaBlocker.   LABORATORY, DIAGNOSTIC AND RADIOLOGICAL DATA: CT of the head showed no acute abnormality. Chest x-ray showed no acute abnormalities. Patient had lumbar puncture. CSF did not show any organisms. CSF protein and glucose were normal. White count was 24 with predominant lymphocytosis and macrocytosis. Herpes simplex culture is pending. CSF culture showed no organisms. CBC normal. Complete metabolic panel normal. Urine drug screen negative. TSH normal.   HOSPITAL COURSE: Patient is a 39 year old female with past medical history of auditory hallucinations, hypothyroidism, ovarian cyst who presented with headache, low-grade fever and syncopal episode. She had been having headache for almost 1 to 2 weeks prior to having the syncopal episode. CT of the head showed no acute abnormalities. Patient underwent a lumbar puncture. The CSF studies were consistent with viral meningitis. Glucose and protein were normal. Culture showed no organisms. There was predominant lymphocytosis and microcytosis. Patient was empirically started on acyclovir and ID consultation was obtained. As per Dr. Leavy CellaBlocker patient likely had viral meningitis which will resolve spontaneously. He did not recommend any outpatient  antibiotics. Patient was informed that her headache can last for almost a week. Her TSH was normal. Her other medical problems remained stable. She is being discharged home in a stable condition.   TIME SPENT: 45 minutes.   ____________________________ Nichole MeigsSangeeta Cephus Tupy, MD sp:cms D: 12/04/2012 16:54:48 ET T: 12/05/2012 10:16:59 ET JOB#: [id number]  cc: Nichole MeigsSangeeta Tysean Vandervliet, MD, <Dictator>  Nichole MeigsSANGEETA Sylvestre Rathgeber MD ELECTRONICALLY SIGNED 01/07/2013 21:15

## 2015-04-21 NOTE — H&P (Signed)
PATIENT NAME:  Nichole Fuller, [name] MR#:  [id number] DATE OF BIRTH:  May 05, 1976  DATE OF ADMISSION:  12/27/2012  IDENTIFYING INFORMATION:  The patient is a  39 year old white female with a long history of mental illness and was recently discharged on 12/04/2012 after being stabilized on the inpatient unit at [facility].  The patient is married for 1 year, lives with her husband who is not employed and currently works  as a Conservation officer, naturecashier.  The patient comes back for re-admission with a chief complaint, "I started hearing voices.  I don't feel good.  These voices bother me and I start getting depressed".    HISTORY OF PRESENT ILLNESS:  The patient was asked when she last felt well, she reported as long as she takes her medications she feels well.  She was last discharged on 12/04/2012 on the following medications:  Celexa 40 mg daily, Synthroid 50 mcg daily, medroxyprogesterone 25 mg daily, Risperdal 3 mg twice a day, Benadryl 25 mg at bedtime, Ultracet 325/37.5 mg 1 tablet q 4. hours p.r.n. for pain.  The patient reports that she ran out of her medications because she will not get paid until next week and so she had been noncompliant and started hearing voices and could not deal with it and so she came back for re-admission.    PAST PSYCHIATRIC HISTORY:  The patient had several inpatient hospitalizations by psychiatry and currently is being [facility] on an outpatient basis.  Her last appointment was about a week ago.  He gave the prescriptions for the medications but she was not able to afford to buy them.  Her next appointment is next month.  SOCIAL HISTORY:  Raised by parents, graduated from high school, no college.  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  Married twice.  Currently marriage is for 1 year.  Has 2 children.  ALCOHOL AND DRUGS:  Denies drinking alcohol, denies street or prescription drug use.  Denies smoking nicotine cigarettes.    PAST MEDICAL HISTORY:  No known  history of high blood pressure, no diabetes mellitus, has hypothyroidism, has had left-sided oophorectomy, right-sided ovarian cystectomy.    ALLERGIES:  1.  LISINOPRIL. 2.  PHENERGAN. 3.  SULFA DRUGS.  The patient is being followed by [place]. [place]ingley [place], [place].  Last appointment was November 2013.  The next appointment is to be made in January.  PHYSICAL EXAMINATION:  VITAL SIGNS:  Temperature 97.4, pulse 98 per minute and regular, respirations 28 per minute and regular, blood pressure 120/80 mmHg. HEENT: Head is normocephalic, atraumatic. Eyes: Pupils are equal, round, and reactive to light and accommodation. Fundi bilaterally benign. Extraocular movements visualized. Tympanic membrane visualized, no exudates. NECK: Supple without any organomegaly, lymphadenopathy.  LUNGS:  Clear to auscultation and percussion. HEART: Normal, S1, S2 heard without any murmurs or gallops.  ABDOMEN: Soft. No organomegaly. Bowel sounds heard.  RECTAL AND PELVIC: Deferred. NEUROLOGICAL: Gait is normal.  Romberg is negative.DTR's 2plus and positive.  MENTAL STATUS EXAMINATION:  The patient is dressed in hospital scrubs, alert and oriented to place, person and time.  Appears to be anxious.  Affect is flat with mood depressed.  Admits that she is hearing voices.  She cannot make out the voices because they are mumbling and these bother her a great deal.  Denies feeling paranoid or suspicious.  Memory is intact.  She could remember her date of birth.  [place] [address] [place] the [place] and named the current president.  She could count money.  Memory and recall are good.  She could spell the word "world" forward and backward.  Does admit to sleep and appetite disturbance because the voices bother her a great deal.  Insight and judgment are guarded.  IMPRESSION:   AXIS I:  Psychotic disorder, not otherwise specified. AXIS II:  Deferred. AXIS III:   Hypothyroidism.                   Status post oophorectomy.                   Status post ovarian cyst removed. AXIS IV:  Occupational, financial and noncompliance with medications because of the same. AXIS V:  Global Assessment of Functioning:  30.  PLAN:  The patient is admitted to [facility].  She will be started back on all of her medications.    ____________________________ Jannet Mantis. Guss Bunde, MD skc:ct D: 12/27/2012 17:18:00 ET T: 12/28/2012 12:07:09 ET JOB#: [id number]  cc: Monika Salk K. Guss Bunde, MD, <Dictator> Beau Fanny MD ELECTRONICALLY SIGNED 12/30/2012 20:38

## 2019-11-02 ENCOUNTER — Emergency Department
Admission: EM | Admit: 2019-11-02 | Discharge: 2019-11-02 | Disposition: A | Discharge status: Home / Self Care | Payer: BC Managed Care – PPO | Attending: Student | Admitting: Student

## 2019-11-02 ENCOUNTER — Emergency Department: Payer: BC Managed Care – PPO

## 2019-11-02 ENCOUNTER — Encounter: Payer: Self-pay | Admitting: Emergency Medicine

## 2019-11-02 ENCOUNTER — Other Ambulatory Visit: Payer: Self-pay

## 2019-11-02 DIAGNOSIS — R590 Localized enlarged lymph nodes: Secondary | ICD-10-CM | POA: Insufficient documentation

## 2019-11-02 DIAGNOSIS — E039 Hypothyroidism, unspecified: Secondary | ICD-10-CM | POA: Diagnosis not present

## 2019-11-02 DIAGNOSIS — Z20828 Contact with and (suspected) exposure to other viral communicable diseases: Secondary | ICD-10-CM | POA: Insufficient documentation

## 2019-11-02 DIAGNOSIS — R220 Localized swelling, mass and lump, head: Secondary | ICD-10-CM | POA: Diagnosis present

## 2019-11-02 LAB — CBC WITH DIFFERENTIAL/PLATELET
Abs Immature Granulocytes: 0.05 10*3/uL (ref 0.00–0.07)
Basophils Absolute: 0 10*3/uL (ref 0.0–0.1)
Basophils Relative: 1 %
Eosinophils Absolute: 0.1 10*3/uL (ref 0.0–0.5)
Eosinophils Relative: 2 %
HCT: 40 % (ref 36.0–46.0)
Hemoglobin: 12.9 g/dL (ref 12.0–15.0)
Immature Granulocytes: 1 %
Lymphocytes Relative: 33 %
Lymphs Abs: 2.2 10*3/uL (ref 0.7–4.0)
MCH: 28.2 pg (ref 26.0–34.0)
MCHC: 32.3 g/dL (ref 30.0–36.0)
MCV: 87.5 fL (ref 80.0–100.0)
Monocytes Absolute: 0.4 10*3/uL (ref 0.1–1.0)
Monocytes Relative: 6 %
Neutro Abs: 4 10*3/uL (ref 1.7–7.7)
Neutrophils Relative %: 57 %
Platelets: 286 10*3/uL (ref 150–400)
RBC: 4.57 MIL/uL (ref 3.87–5.11)
RDW: 13.6 % (ref 11.5–15.5)
WBC: 6.8 10*3/uL (ref 4.0–10.5)
nRBC: 0 % (ref 0.0–0.2)

## 2019-11-02 LAB — GROUP A STREP BY PCR: Group A Strep by PCR: NOT DETECTED

## 2019-11-02 LAB — COMPREHENSIVE METABOLIC PANEL
ALT: 22 U/L (ref 0–44)
AST: 17 U/L (ref 15–41)
Albumin: 4 g/dL (ref 3.5–5.0)
Alkaline Phosphatase: 75 U/L (ref 38–126)
Anion gap: 11 (ref 5–15)
BUN: 15 mg/dL (ref 6–20)
CO2: 26 mmol/L (ref 22–32)
Calcium: 9.5 mg/dL (ref 8.9–10.3)
Chloride: 101 mmol/L (ref 98–111)
Creatinine, Ser: 0.9 mg/dL (ref 0.44–1.00)
GFR calc Af Amer: >60 mL/min (ref >60)
GFR calc non Af Amer: >60 mL/min (ref >60)
Glucose, Bld: 107 mg/dL — ABNORMAL HIGH (ref 70–99)
Potassium: 3.5 mmol/L (ref 3.5–5.1)
Sodium: 138 mmol/L (ref 135–145)
Total Bilirubin: 0.8 mg/dL (ref 0.3–1.2)
Total Protein: 7.6 g/dL (ref 6.5–8.1)

## 2019-11-02 LAB — LACTIC ACID, PLASMA: Lactic Acid, Venous: 0.9 mmol/L (ref 0.5–1.9)

## 2019-11-02 LAB — SARS CORONAVIRUS 2 BY RT PCR (HOSPITAL ORDER, PERFORMED IN ~~LOC~~ HOSPITAL LAB): SARS Coronavirus 2: NEGATIVE

## 2019-11-02 MED ORDER — SODIUM CHLORIDE 0.9 % IV SOLN
3.0000 g | Freq: Once | INTRAVENOUS | Status: AC
  Administered 2019-11-02: 19:00:00 3 g via INTRAVENOUS
  Filled 2019-11-02: qty 8

## 2019-11-02 MED ORDER — PREDNISONE 50 MG PO TABS: 50.0000 mg | ORAL_TABLET | Freq: Every day | ORAL | 0 refills | Status: DC

## 2019-11-02 MED ORDER — IOHEXOL 300 MG/ML  SOLN
75.0000 mL | Freq: Once | INTRAMUSCULAR | Status: AC | PRN
  Administered 2019-11-02: 75 mL via INTRAVENOUS
  Filled 2019-11-02: qty 75

## 2019-11-02 MED ORDER — SODIUM CHLORIDE 0.9 % IV BOLUS
1000.0000 mL | Freq: Once | INTRAVENOUS | Status: AC
  Administered 2019-11-02: 18:00:00 1000 mL via INTRAVENOUS

## 2019-11-02 NOTE — ED Notes (Signed)
See triage note... presents with possible abscess under tongue /chin area   States noticed area yesterday

## 2019-11-02 NOTE — ED Provider Notes (Signed)
[facility] Emergency Department Provider Note  ____________________________________________  Time seen: Approximately 4:58 PM  I have reviewed the triage vital signs and the nursing notes.   HISTORY  Chief Complaint Abscess    HPI Nichole Fuller is a 43 y.o. female who presents the emergency department concern for pain, edema to the submandibular/sublingular region as well as a sore throat .  Patient states that she began to have pain, swelling to the sublingual/submandibular region yesterday.  This has progressed and now includes sore throat as well.  Patient denies any fevers but endorses chills.  She denies any difficulty breathing or swallowing.  Patient was evaluated at urgent care and referred to the emergency department for further evaluation.  Patient does have a history of hypothyroidism and takes Synthroid but denies any pain to region of thyroid gland.  Patient is having primary pain and edema in the midline sublingular/submandibular region.  Patient has no headache, visual changes, nasal congestion, cough, shortness of breath, Donnell pain, nausea or vomiting.  No medications prior to arrival.        Past Medical History:  Diagnosis Date  . Hypothyroid   . Rheumatoid arthritis(714.0)     There are no active problems to display for this patient.   Past Surgical History:  Procedure Laterality Date  . ABDOMINAL HYSTERECTOMY    . APPENDECTOMY    . OVARIAN CYST SURGERY      Prior to Admission medications   Medication Sig Start Date End Date Taking? Authorizing Provider  predniSONE (DELTASONE) 50 MG tablet Take 1 tablet (50 mg total) by mouth daily with breakfast. 11/02/19   , Charline Bills, PA-C    Allergies Lisinopril, Phenergan [promethazine], and Sulfa antibiotics  History reviewed. No pertinent family history.  Social History Social History   Tobacco Use  . Smoking status: Never Smoker  . Smokeless tobacco: Never Used   Substance Use Topics  . Alcohol use: No  . Drug use: No     Review of Systems  Constitutional: No fever/chills Eyes: No visual changes. No discharge ENT: Positive for pain and edema to the sublingual/submandibular region Cardiovascular: no chest pain. Respiratory: no cough. No SOB. Gastrointestinal: No abdominal pain.  No nausea, no vomiting.  No diarrhea.  No constipation. Musculoskeletal: Negative for musculoskeletal pain. Skin: Negative for rash, abrasions, lacerations, ecchymosis. Neurological: Negative for headaches, focal weakness or numbness. 10-point ROS otherwise negative.  ____________________________________________   PHYSICAL EXAM:  VITAL SIGNS: ED Triage Vitals  Enc Vitals Group     BP 11/02/19 1546 137/71     Pulse Rate 11/02/19 1546 72     Resp 11/02/19 1546 16     Temp 11/02/19 1546 98.7 F (37.1 C)     Temp Source 11/02/19 1546 Oral     SpO2 11/02/19 1546 98 %     Weight 11/02/19 1547 288 lb (130.6 kg)     Height 11/02/19 1547 5\' 1"  (1.549 m)     Head Circumference --      Peak Flow --      Pain Score 11/02/19 1547 4     Pain Loc --      Pain Edu? --      Excl. in [place]? --      Constitutional: Alert and oriented. Well appearing and in no acute distress. Eyes: Conjunctivae are normal. PERRL. EOMI. Head: Atraumatic. ENT:      Ears:       Nose: No congestion/rhinnorhea.  Mouth/Throat: Mucous membranes are moist.  Visualization of the oropharynx reveals no gross erythema or edema in the posterior oropharynx.  No exudates.  On examination of the sublingual region, edema is appreciated in the sublingular space.  Area is very tender to palpation.  No fluctuance on palpation.  Examination of the submandibular region reveals corresponding edema in the submandibular region.  No erythema overlying the skin.  Patient is tender from the inferior aspect of the chin to the submental region. Neck: No stridor.  No cervical spine tenderness to palpation.   Visualization of the anterior neck reveals no gross edema or erythema.  Palpation reveals no palpable findings in the anterior cervical region. Hematological/Lymphatic/Immunilogical: No cervical lymphadenopathy. Cardiovascular: Normal rate, regular rhythm. Normal S1 and S2.  Good peripheral circulation. Respiratory: Normal respiratory effort without tachypnea or retractions. Lungs CTAB. Good air entry to the bases with no decreased or absent breath sounds. Musculoskeletal: Full range of motion to all extremities. No gross deformities appreciated. Neurologic:  Normal speech and language. No gross focal neurologic deficits are appreciated.  Skin:  Skin is warm, dry and intact. No rash noted. Psychiatric: Mood and affect are normal. Speech and behavior are normal. Patient exhibits appropriate insight and judgement.   ____________________________________________   LABS (all labs ordered are listed, but only abnormal results are displayed)  Labs Reviewed  COMPREHENSIVE METABOLIC PANEL - Abnormal; Notable for the following components:      Result Value   Glucose, Bld 107 (*)    All other components within normal limits  GROUP A STREP BY PCR  SARS CORONAVIRUS 2 BY RT PCR (HOSPITAL ORDER, PERFORMED IN [facility])  CULTURE, BLOOD (ROUTINE X 2)  CULTURE, BLOOD (ROUTINE X 2)  LACTIC ACID, PLASMA  CBC WITH DIFFERENTIAL/PLATELET  URINALYSIS, COMPLETE (UACMP) WITH MICROSCOPIC   ____________________________________________  EKG   ____________________________________________  RADIOLOGY I personally viewed and evaluated these images as part of my medical decision making, as well as reviewing the written report by the radiologist.  I concur with radiologist finding of submental lymphadenopathy without appreciable abscess or loculated fluid collection.  Ct Soft Tissue Neck W Contrast  Result Date: 11/02/2019 CLINICAL DATA:  Initial evaluation for possible abscess beneath  tongue/chin area. Sore throat, stridor. EXAM: CT NECK WITH CONTRAST TECHNIQUE: Multidetector CT imaging of the neck was performed using the standard protocol following the bolus administration of intravenous contrast. CONTRAST:  75mL OMNIPAQUE IOHEXOL 300 MG/ML  SOLN COMPARISON:  None available. FINDINGS: Pharynx and larynx: Oral cavity within normal limits without discrete mass or loculated collection. No significant swelling or inflammatory changes seen about the floor of mouth. No inflammatory changes about the dentition. No base of tongue lesion. Palatine tonsils symmetric and within normal limits. Subcentimeter calcified tonsillith noted on the left. No tonsillar or peritonsillar abscess. Parapharyngeal fat maintained. Remainder of the oropharynx and nasopharynx within normal limits. No retropharyngeal collection. Epiglottis normal. Vallecula clear. Remainder of the hypopharynx and supraglottic larynx within normal limits. True cords symmetric and normal. Subglottic airway clear. Salivary glands: Salivary glands including the parotid and submandibular glands are within normal limits. Thyroid: Thyroid normal. Lymph nodes: Enlarged right level Ia submental node measures 13 mm in size (series 2, image 55). Query this as palpable abnormality of concern. There is an additional mildly enlarged 11 mm left level 1b node (series 2, image 51). Findings are nonspecific, but could be reactive in nature. No other abnormal adenopathy within the neck. Vascular: Normal intravascular enhancement seen throughout the neck.  Limited intracranial: Unremarkable. Visualized orbits: Visualized globes and orbital soft tissues within normal limits. Mastoids and visualized paranasal sinuses: Visualized paranasal sinuses are well pneumatized and clear. Visualized mastoids and middle ear cavities are clear as well. Skeleton: No acute osseous finding. No discrete lytic or blastic osseous lesions. Moderate cervical spondylosis noted at C6-7.  Upper chest: Visualized upper chest demonstrates no acute finding. Partially visualized lungs are grossly clear. Other: None. IMPRESSION: 1. Two adjacent mildly enlarged left submandibular and right submental nodes as above, indeterminate, but could be reactive in nature. Query this as the area of concern. Clinical follow-up to resolution recommended. No discrete abscess or drainable fluid collection. No other significant inflammatory changes within the neck. 2. Moderate cervical spondylosis at C6-7. Electronically Signed   By: Rise MuBenjamin  McClintock M.D.   On: 11/02/2019 19:52    ____________________________________________    PROCEDURES  Procedure(s) performed:    Procedures    Medications  sodium chloride 0.9 % bolus 1,000 mL (1,000 mLs Intravenous New Bag/Given 11/02/19 1758)  Ampicillin-Sulbactam (UNASYN) 3 g in sodium chloride 0.9 % 100 mL IVPB (0 g Intravenous Stopped 11/02/19 1929)  iohexol (OMNIPAQUE) 300 MG/ML solution 75 mL (75 mLs Intravenous Contrast Given 11/02/19 1926)     ____________________________________________   INITIAL IMPRESSION / ASSESSMENT AND PLAN / ED COURSE  Pertinent labs & imaging results that were available during my care of the patient were reviewed by me and considered in my medical decision making (see chart for details).  Review of the [place] CSRS was performed in accordance of the [facility] prior to dispensing any controlled drugs.  Clinical Course as of Nov 01 2016  Tue Nov 02, 2019  2000 Patient presented to emergency department complaining of lesions underneath the chin/tongue region.  Patient reports that she has had 2 days of increasing pain and swelling to this region.  There were no overlying skin changes.  Overall dentition looked unremarkable.  Patient did have some sublingular edema as well as submental edema.  Palpable lesions were appreciated in this area and labs and imaging were undertaken.  Thankfully labs returned with reassuring results.   Imaging reveals 2 likely reactive lymph nodes in this region which would be correlated with palpable lesions.  No evidence of deep space infection on CT scan.  With reassuring labs, reassuring imaging, patient will be diagnosed with reactive lymphadenopathy.  Return precautions are discussed at length with the patient.   [JC]    Clinical Course User Index [JC] , Delorise RoyalsJonathan D, PA-C          Patient's diagnosis is consistent with submental lymphadenopathy.  Patient presented to emergency department with pain and edema under the submandibular region.  Assessment of this area revealed palpable findings in the submental region.  Given these findings patient was evaluated with labs and imaging.  Differential included Ludwick's angina, cellulitis, sial adenitis, sublingual or abscess, lymphadenopathy, Lemierre's disease.  At this time evidence of 2 enlarged submental lymph nodes are appreciated without any other appreciable source of infection.  At this time I will prescribe the patient a short course of prednisone just for inflammation control, however I discussed with any sources/signs of infection that prednisone could worsen the infection.  Patient verbalizes understanding if she develops sore throat, dental pain or other signs of infection she will stop the prednisone.  Patient is just moved to the area but has an appointment with primary care in 2 weeks.  Patient is encouraged to return to emergency department for any significant  changes or any areas of concern.  Otherwise patient will follow up with primary care in 2 weeks..Patient is given ED precautions to return to the ED for any worsening or new symptoms.     ____________________________________________  FINAL CLINICAL IMPRESSION(S) / ED DIAGNOSES  Final diagnoses:  Submental lymphadenopathy      NEW MEDICATIONS STARTED DURING THIS VISIT:  ED Discharge Orders         Ordered    predniSONE (DELTASONE) 50 MG tablet  Daily with  breakfast     11/02/19 2014              This chart was dictated using voice recognition software/Dragon. Despite best efforts to proofread, errors can occur which can change the meaning. Any change was purely unintentional.    Racheal Patches, PA-C 11/02/19 2017    Miguel Aschoff., MD 11/03/19 [phone]

## 2019-11-02 NOTE — ED Triage Notes (Signed)
Pt to ED from home c/o abscess and tenderness below chin.  Denies SOB, difficulty swallowing, maintaining secretions.  Denies drainage or fevers.

## 2019-11-07 LAB — CULTURE, BLOOD (ROUTINE X 2)
Culture: NO GROWTH
Culture: NO GROWTH
Special Requests: ADEQUATE
Special Requests: ADEQUATE

## 2021-09-16 ENCOUNTER — Emergency Department
Admission: EM | Admit: 2021-09-16 | Discharge: 2021-09-16 | Discharge status: Absent without leave | Payer: BC Managed Care – PPO

## 2021-09-17 ENCOUNTER — Emergency Department: Payer: BC Managed Care – PPO

## 2021-09-17 ENCOUNTER — Emergency Department
Admission: EM | Admit: 2021-09-17 | Discharge: 2021-09-17 | Disposition: A | Discharge status: Home / Self Care | Payer: BC Managed Care – PPO | Attending: Emergency Medicine | Admitting: Emergency Medicine

## 2021-09-17 ENCOUNTER — Other Ambulatory Visit: Payer: Self-pay

## 2021-09-17 DIAGNOSIS — R1013 Epigastric pain: Secondary | ICD-10-CM | POA: Insufficient documentation

## 2021-09-17 DIAGNOSIS — R1011 Right upper quadrant pain: Secondary | ICD-10-CM | POA: Insufficient documentation

## 2021-09-17 DIAGNOSIS — R110 Nausea: Secondary | ICD-10-CM | POA: Insufficient documentation

## 2021-09-17 DIAGNOSIS — E039 Hypothyroidism, unspecified: Secondary | ICD-10-CM | POA: Diagnosis not present

## 2021-09-17 DIAGNOSIS — R197 Diarrhea, unspecified: Secondary | ICD-10-CM | POA: Insufficient documentation

## 2021-09-17 LAB — COMPREHENSIVE METABOLIC PANEL
ALT: 68 U/L — ABNORMAL HIGH (ref 0–44)
AST: 32 U/L (ref 15–41)
Albumin: 4.4 g/dL (ref 3.5–5.0)
Alkaline Phosphatase: 71 U/L (ref 38–126)
Anion gap: 9 (ref 5–15)
BUN: 14 mg/dL (ref 6–20)
CO2: 26 mmol/L (ref 22–32)
Calcium: 9.8 mg/dL (ref 8.9–10.3)
Chloride: 103 mmol/L (ref 98–111)
Creatinine, Ser: 0.87 mg/dL (ref 0.44–1.00)
GFR, Estimated: >60 mL/min (ref >60)
Glucose, Bld: 94 mg/dL (ref 70–99)
Potassium: 4 mmol/L (ref 3.5–5.1)
Sodium: 138 mmol/L (ref 135–145)
Total Bilirubin: 1.4 mg/dL — ABNORMAL HIGH (ref 0.3–1.2)
Total Protein: 7.6 g/dL (ref 6.5–8.1)

## 2021-09-17 LAB — CBC
HCT: 41.3 % (ref 36.0–46.0)
Hemoglobin: 14.3 g/dL (ref 12.0–15.0)
MCH: 29.9 pg (ref 26.0–34.0)
MCHC: 34.6 g/dL (ref 30.0–36.0)
MCV: 86.2 fL (ref 80.0–100.0)
Platelets: 314 10*3/uL (ref 150–400)
RBC: 4.79 MIL/uL (ref 3.87–5.11)
RDW: 12.7 % (ref 11.5–15.5)
WBC: 7.1 10*3/uL (ref 4.0–10.5)
nRBC: 0 % (ref 0.0–0.2)

## 2021-09-17 LAB — LIPASE, BLOOD: Lipase: 35 U/L (ref 11–51)

## 2021-09-17 MED ORDER — KETOROLAC TROMETHAMINE 30 MG/ML IJ SOLN
15.0000 mg | Freq: Once | INTRAMUSCULAR | Status: AC
  Administered 2021-09-17: 15 mg via INTRAMUSCULAR
  Filled 2021-09-17: qty 1

## 2021-09-17 MED ORDER — IOHEXOL 350 MG/ML SOLN
100.0000 mL | Freq: Once | INTRAVENOUS | Status: AC | PRN
  Administered 2021-09-17: 100 mL via INTRAVENOUS
  Filled 2021-09-17: qty 100

## 2021-09-17 MED ORDER — FAMOTIDINE 20 MG PO TABS
20.0000 mg | ORAL_TABLET | Freq: Once | ORAL | Status: AC
  Administered 2021-09-17: 20 mg via ORAL
  Filled 2021-09-17: qty 1

## 2021-09-17 MED ORDER — ALUM & MAG HYDROXIDE-SIMETH 200-200-20 MG/5ML PO SUSP
30.0000 mL | Freq: Once | ORAL | Status: AC
  Administered 2021-09-17: 30 mL via ORAL
  Filled 2021-09-17: qty 30

## 2021-09-17 MED ORDER — SUCRALFATE 1 G PO TABS: 1.0000 g | ORAL_TABLET | Freq: Four times a day (QID) | ORAL | 1 refills | Status: DC

## 2021-09-17 MED ORDER — PANTOPRAZOLE SODIUM 40 MG PO TBEC: 40.0000 mg | DELAYED_RELEASE_TABLET | Freq: Every day | ORAL | 1 refills | Status: DC

## 2021-09-17 MED ORDER — LIDOCAINE VISCOUS HCL 2 % MT SOLN
15.0000 mL | Freq: Once | OROMUCOSAL | Status: AC
  Administered 2021-09-17: 15 mL via ORAL
  Filled 2021-09-17: qty 15

## 2021-09-17 MED ORDER — ONDANSETRON 4 MG PO TBDP
4.0000 mg | ORAL_TABLET | Freq: Once | ORAL | Status: AC
  Administered 2021-09-17: 4 mg via ORAL
  Filled 2021-09-17: qty 1

## 2021-09-17 NOTE — ED Provider Notes (Signed)
-----------------------------------------   9:19 PM on 09/17/2021 ----------------------------------------- Patient CT scan is negative.  Patient states she is ready to go home.  Patient has no lower abdominal discomfort, urinalysis has not been obtained yet however patient states she is ready to go home.  We will treat the patient for upper abdominal pain gastritis versus peptic ulcer disease.  I discussed with the patient GI follow-up we will start the patient on Carafate and Protonix.  Discussed return precautions.  Patient agreeable to plan of care.   Minna Antis, MD 09/17/21 2119

## 2021-09-17 NOTE — ED Triage Notes (Signed)
Pt to ED for emesis that started Saturday morning. Reports epigastric pain radiating to RUQ. Denies emesis in the past 24 hours.

## 2021-09-17 NOTE — ED Provider Notes (Signed)
[facility]  ____________________________________________   Event Date/Time   First MD Initiated Contact with Patient 09/17/21 1621     (approximate)  I have reviewed the triage vital signs and the nursing notes.   HISTORY  Chief Complaint Emesis    HPI Nichole Fuller is a 45 y.o. female past medical history of hypothyroidism and rheumatoid arthritis who presents with abdominal pain.  Symptoms started last night around 1 AM.  They woke her from sleep.  She endorses pain in the epigastrium that radiates to the right upper quadrant.  She has nausea but no vomiting.  Had diarrhea yesterday which is now resolving.  She denies fevers or chills.  Patient had 2 similar episodes 1 in July and one in August.  Both were self resolving she did not seek care.  Has never been diagnosed with gallstones.  Denies urinary symptoms.         Past Medical History:  Diagnosis Date   Hypothyroid    Rheumatoid arthritis(714.0)     There are no problems to display for this patient.   Past Surgical History:  Procedure Laterality Date   ABDOMINAL HYSTERECTOMY     APPENDECTOMY     OVARIAN CYST SURGERY      Prior to Admission medications   Medication Sig Start Date End Date Taking? Authorizing Provider  predniSONE (DELTASONE) 50 MG tablet Take 1 tablet (50 mg total) by mouth daily with breakfast. 11/02/19   Cuthriell, Delorise Royals, PA-C    Allergies Lisinopril, Phenergan [promethazine], and Sulfa antibiotics  No family history on file.  Social History Social History   Tobacco Use   Smoking status: Never   Smokeless tobacco: Never  Substance Use Topics   Alcohol use: No   Drug use: No    Review of Systems   Review of Systems  Constitutional:  Negative for chills and fever.  Respiratory:  Negative for shortness of breath.   Cardiovascular:  Negative for chest pain.  Gastrointestinal:  Positive for abdominal pain, diarrhea and nausea. Negative for  constipation and vomiting.  Genitourinary:  Negative for dysuria.  All other systems reviewed and are negative.  Physical Exam Updated Vital Signs BP 115/69   Pulse 90   Temp 97.8 F (36.6 C) (Oral)   Resp 20   Ht 5\' 1"  (1.549 m)   Wt 115.2 kg   SpO2 96%   BMI 47.99 kg/m   Physical Exam Vitals and nursing note reviewed.  Constitutional:      General: She is not in acute distress.    Appearance: Normal appearance.  HENT:     Head: Normocephalic and atraumatic.  Eyes:     General: No scleral icterus.    Conjunctiva/sclera: Conjunctivae normal.  Pulmonary:     Effort: Pulmonary effort is normal. No respiratory distress.     Breath sounds: No stridor.  Abdominal:     Palpations: Abdomen is soft.     Comments: Patient in the epigastrium and right upper quadrant area, no guarding  Musculoskeletal:        General: No deformity or signs of injury.     Cervical back: Normal range of motion.  Skin:    General: Skin is dry.     Coloration: Skin is not jaundiced or pale.  Neurological:     General: No focal deficit present.     Mental Status: She is alert and oriented to person, place, and time. Mental status is at baseline.  Psychiatric:  Mood and Affect: Mood normal.        Behavior: Behavior normal.     LABS (all labs ordered are listed, but only abnormal results are displayed)  Labs Reviewed  COMPREHENSIVE METABOLIC PANEL - Abnormal; Notable for the following components:      Result Value   ALT 68 (*)    Total Bilirubin 1.4 (*)    All other components within normal limits  LIPASE, BLOOD  CBC  URINALYSIS, COMPLETE (UACMP) WITH MICROSCOPIC   ____________________________________________  EKG  Normal sinus rhythm, normal axis, normal intervals, no acute ischemic changes ____________________________________________  RADIOLOGY Ky Barban, personally viewed and evaluated these images (plain radiographs) as part of my medical decision making, as  well as reviewing the written report by the radiologist.  ED MD interpretation: I reviewed the gallbladder ultrasound which is negative for stones or cholecystitis    ____________________________________________   PROCEDURES  Procedure(s) performed (including Critical Care):  Procedures   ____________________________________________   INITIAL IMPRESSION / ASSESSMENT AND PLAN / ED COURSE     45 year old female presents with right upper quadrant pain nausea.  She appears well on exam.  She does have tenderness in the epigastrium and right upper quadrant with no guarding.  Labs are overall reassuring, mildly elevated bili and ALT.  I suspect either biliary colic versus cholecystitis.  Will obtain right upper quadrant ultrasound.  Pain control with Toradol, antiemetics.  Right upper quadrant ultrasound is essentially negative, there are no gallstones and no cholecystitis.  There is a liver cyst but this has been chronic.  On repeat evaluation patient still uncomfortable.  Her abdominal pain seems to be more diffuse now.  Will obtain CT abdomen pelvis to rule out other surgical pathology.  At the time of signout she is pending the read of her CT.  Disposition pending imaging.      ____________________________________________   FINAL CLINICAL IMPRESSION(S) / ED DIAGNOSES  Final diagnoses:  RUQ pain     ED Discharge Orders     None        Note:  This document was prepared using Dragon voice recognition software and may include unintentional dictation errors.    Georga Hacking, MD 09/17/21 [phone]

## 2021-10-30 IMAGING — CT CT NECK W/ CM
4 series · 15 of 33 positions shown, 18 images · IV contrast (omnipaque)
Comparison: None available.

CLINICAL DATA: Initial evaluation for possible abscess beneath
tongue/chin area. Sore throat, stridor.

EXAM:
CT NECK WITH CONTRAST
TECHNIQUE: Multidetector CT imaging of the neck was performed using the
standard protocol following the bolus administration of intravenous
contrast.
CONTRAST:  75mL OMNIPAQUE IOHEXOL 300 MG/ML  SOLN

[Series 2: axial neck · axial · 0.54mm/px · z∈[-312,-168]mm · 5 of 109 slices shown, 7 images]
[im 19/109  soft-tissue]
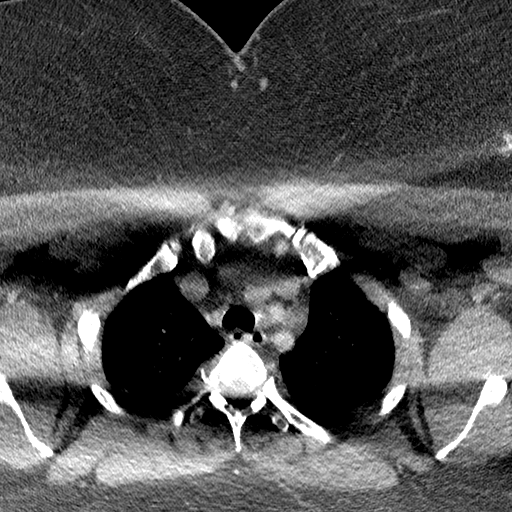
[im 19/109  bone]
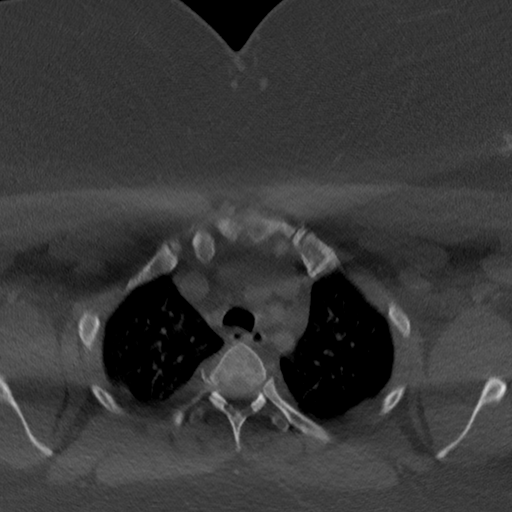
[im 37/109  bone]
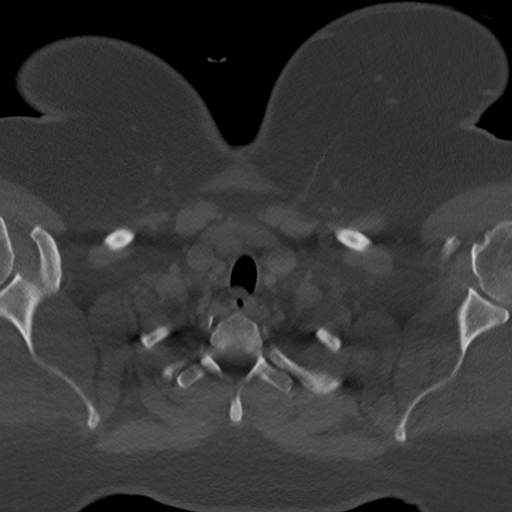
[im 55/109  bone]
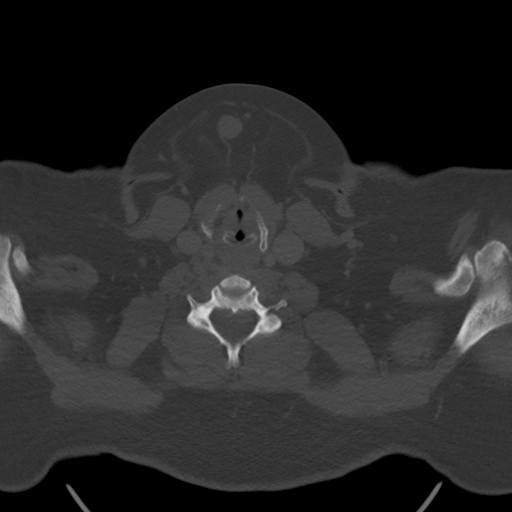
[im 73/109  bone]
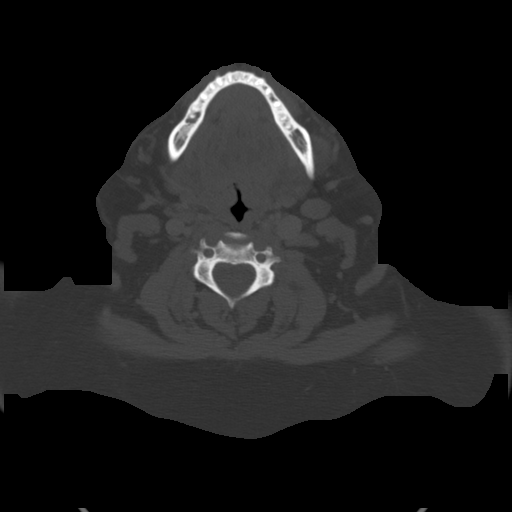
[im 91/109  soft-tissue]
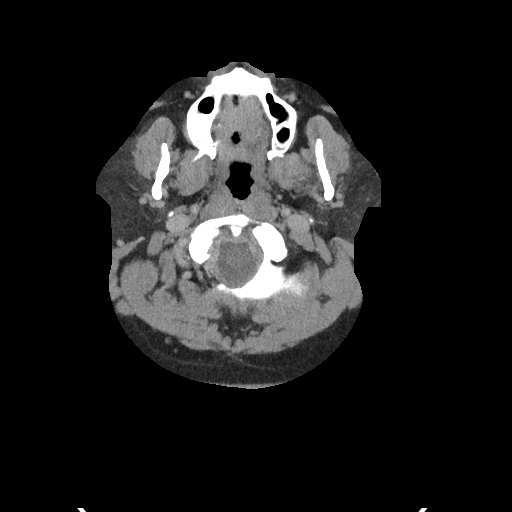
[im 91/109  bone]
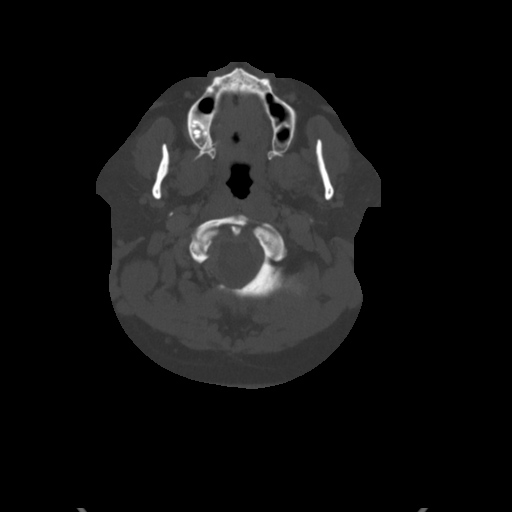

[Series 5: sag neck · sagittal · 0.47mm/px · 5 of 84 slices shown, 6 images]
[im 28/84  bone]
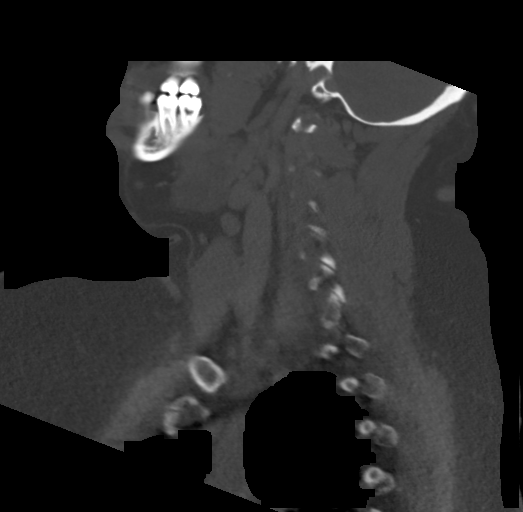
[im 35/84  bone]
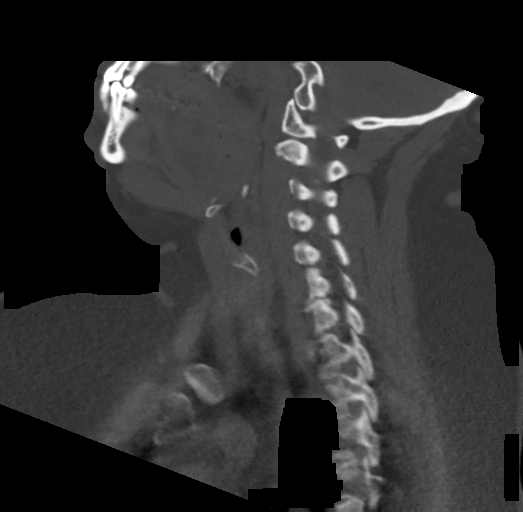
[im 42/84  soft-tissue]
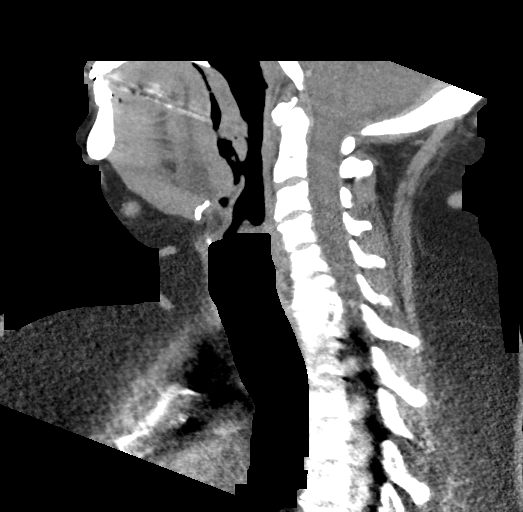
[im 42/84  bone]
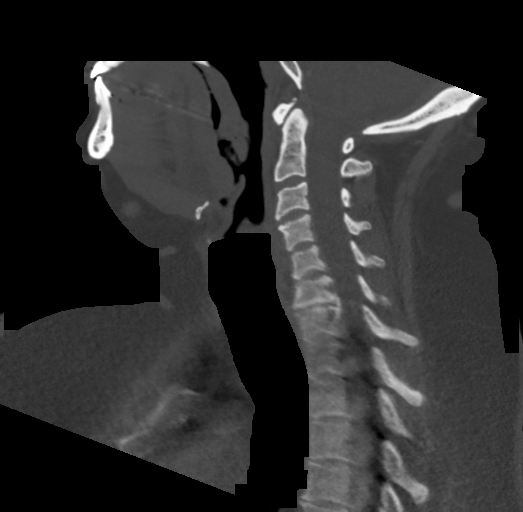
[im 49/84  bone]
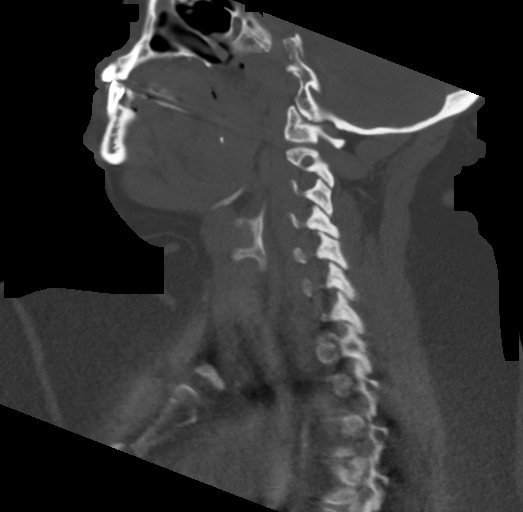
[im 56/84  bone]
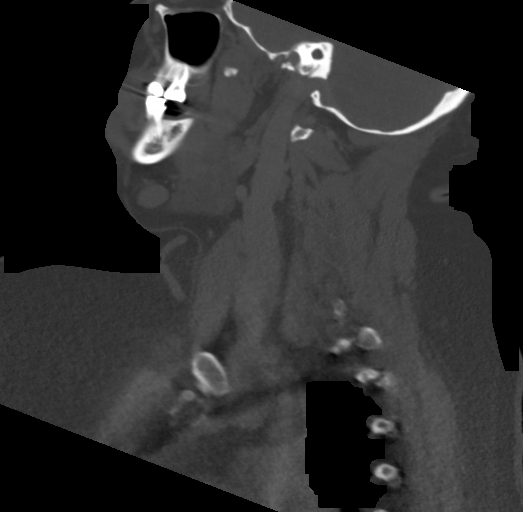

[Series 6: cor neck · coronal · 0.39mm/px · 3 of 99 slices shown]
[im 30/99  bone]
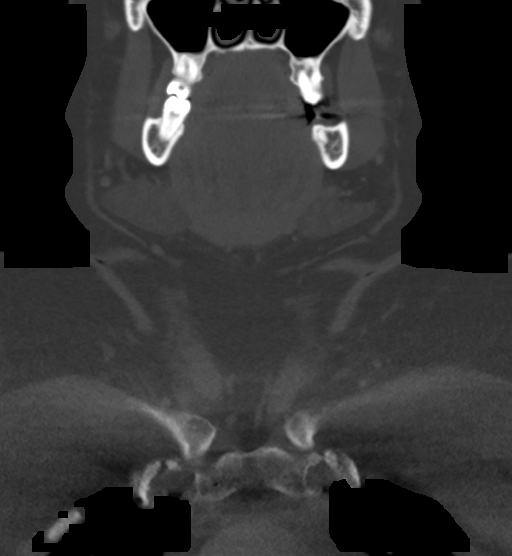
[im 43/99  bone]
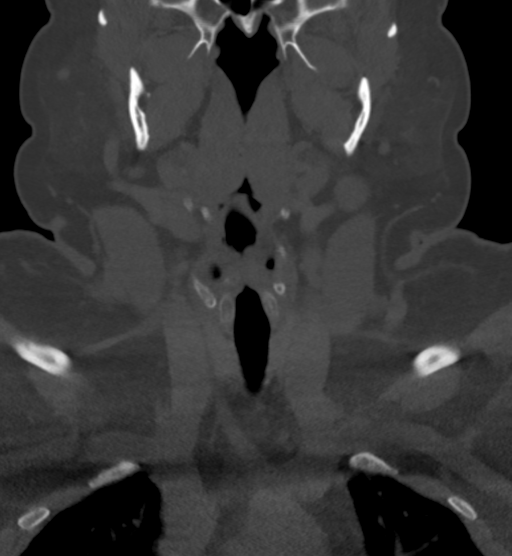
[im 56/99  bone]
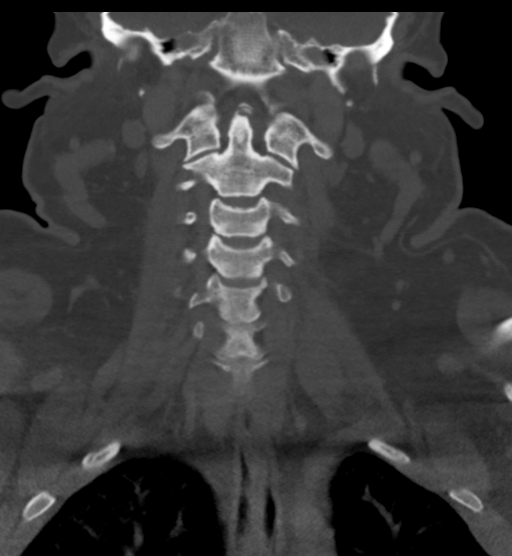

[Series 7: orthogonal ax · axial · 0.39mm/px · z∈[-353,-319]mm · 2 of 116 slices shown]
[im 20/116  bone]
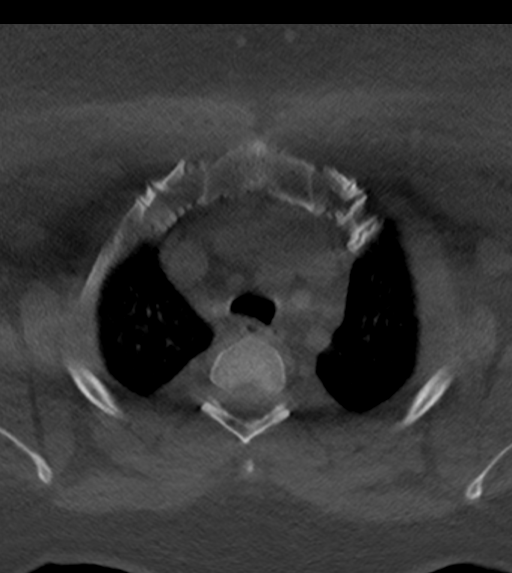
[im 39/116  bone]
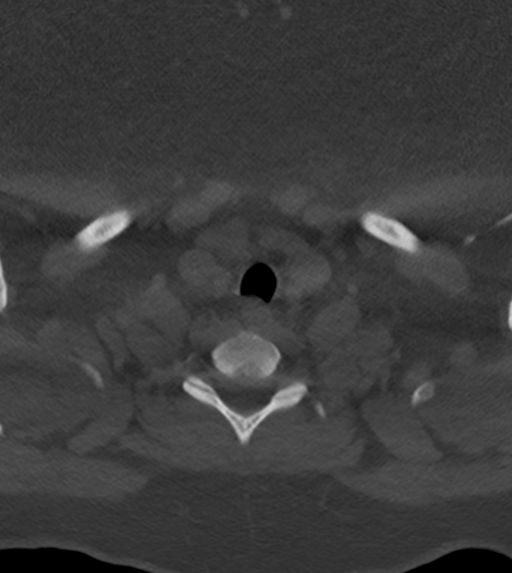

[15 of 33 positions shown; findings below may reference images not displayed]

FINDINGS: Pharynx and larynx: Oral cavity within normal limits without
discrete mass or loculated collection. No significant swelling or
inflammatory changes seen about the floor of mouth. No inflammatory
changes about the dentition. No base of tongue lesion. Palatine
tonsils symmetric and within normal limits. Subcentimeter calcified
tonsillith noted on the left. No tonsillar or peritonsillar abscess.
Parapharyngeal fat maintained. Remainder of the oropharynx and
nasopharynx within normal limits. No retropharyngeal collection.
Epiglottis normal. Vallecula clear. Remainder of the hypopharynx and
supraglottic larynx within normal limits. True cords symmetric and
normal. Subglottic airway clear.

Salivary glands: Salivary glands including the parotid and
submandibular glands are within normal limits.

Thyroid: Thyroid normal.

Lymph nodes: Enlarged right level Ia submental node measures 13 mm
in size (series 2, image 55). Query this as palpable abnormality of
concern. There is an additional mildly enlarged 11 mm left level 1b
node (series 2, image 51). Findings are nonspecific, but could be
reactive in nature. No other abnormal adenopathy within the neck.

Vascular: Normal intravascular enhancement seen throughout the neck.

Limited intracranial: Unremarkable.

Visualized orbits: Visualized globes and orbital soft tissues within
normal limits.

Mastoids and visualized paranasal sinuses: Visualized paranasal
sinuses are well pneumatized and clear. Visualized mastoids and
middle ear cavities are clear as well.

Skeleton: No acute osseous finding. No discrete lytic or blastic
osseous lesions. Moderate cervical spondylosis noted at C6-7.

Upper chest: Visualized upper chest demonstrates no acute finding.
Partially visualized lungs are grossly clear.

Other: None.
IMPRESSION: 1. Two adjacent mildly enlarged left submandibular and right
submental nodes as above, indeterminate, but could be reactive in
nature. Query this as the area of concern. Clinical follow-up to
resolution recommended. No discrete abscess or drainable fluid
collection. No other significant inflammatory changes within the
neck.
2. Moderate cervical spondylosis at C6-7.

## 2022-09-29 ENCOUNTER — Encounter: Payer: Self-pay | Admitting: Emergency Medicine

## 2022-09-29 ENCOUNTER — Other Ambulatory Visit: Payer: Self-pay

## 2022-09-29 ENCOUNTER — Emergency Department
Admission: EM | Admit: 2022-09-29 | Discharge: 2022-09-29 | Disposition: A | Discharge status: Home / Self Care | Payer: 59 | Attending: Emergency Medicine | Admitting: Emergency Medicine

## 2022-09-29 DIAGNOSIS — E039 Hypothyroidism, unspecified: Secondary | ICD-10-CM | POA: Insufficient documentation

## 2022-09-29 DIAGNOSIS — R1013 Epigastric pain: Secondary | ICD-10-CM | POA: Diagnosis not present

## 2022-09-29 DIAGNOSIS — T50905A Adverse effect of unspecified drugs, medicaments and biological substances, initial encounter: Secondary | ICD-10-CM

## 2022-09-29 DIAGNOSIS — T383X5A Adverse effect of insulin and oral hypoglycemic [antidiabetic] drugs, initial encounter: Secondary | ICD-10-CM | POA: Diagnosis not present

## 2022-09-29 DIAGNOSIS — R112 Nausea with vomiting, unspecified: Secondary | ICD-10-CM | POA: Insufficient documentation

## 2022-09-29 LAB — CBC
HCT: 43 % (ref 36.0–46.0)
Hemoglobin: 14.2 g/dL (ref 12.0–15.0)
MCH: 27.7 pg (ref 26.0–34.0)
MCHC: 33 g/dL (ref 30.0–36.0)
MCV: 83.8 fL (ref 80.0–100.0)
Platelets: 310 10*3/uL (ref 150–400)
RBC: 5.13 MIL/uL — ABNORMAL HIGH (ref 3.87–5.11)
RDW: 13.3 % (ref 11.5–15.5)
WBC: 5.3 10*3/uL (ref 4.0–10.5)
nRBC: 0 % (ref 0.0–0.2)

## 2022-09-29 LAB — COMPREHENSIVE METABOLIC PANEL
ALT: 64 U/L — ABNORMAL HIGH (ref 0–44)
AST: 41 U/L (ref 15–41)
Albumin: 4.1 g/dL (ref 3.5–5.0)
Alkaline Phosphatase: 105 U/L (ref 38–126)
Anion gap: 10 (ref 5–15)
BUN: 11 mg/dL (ref 6–20)
CO2: 25 mmol/L (ref 22–32)
Calcium: 9.5 mg/dL (ref 8.9–10.3)
Chloride: 106 mmol/L (ref 98–111)
Creatinine, Ser: 0.72 mg/dL (ref 0.44–1.00)
GFR, Estimated: >60 mL/min (ref >60)
Glucose, Bld: 222 mg/dL — ABNORMAL HIGH (ref 70–99)
Potassium: 4.1 mmol/L (ref 3.5–5.1)
Sodium: 141 mmol/L (ref 135–145)
Total Bilirubin: 0.9 mg/dL (ref 0.3–1.2)
Total Protein: 7.2 g/dL (ref 6.5–8.1)

## 2022-09-29 LAB — LIPASE, BLOOD: Lipase: 28 U/L (ref 11–51)

## 2022-09-29 MED ORDER — METOCLOPRAMIDE HCL 5 MG/ML IJ SOLN
10.0000 mg | Freq: Once | INTRAMUSCULAR | Status: AC
  Administered 2022-09-29: 10 mg via INTRAVENOUS
  Filled 2022-09-29: qty 2

## 2022-09-29 MED ORDER — LACTATED RINGERS IV BOLUS
1000.0000 mL | Freq: Once | INTRAVENOUS | Status: AC
  Administered 2022-09-29: 1000 mL via INTRAVENOUS

## 2022-09-29 MED ORDER — ONDANSETRON 4 MG PO TBDP: 4.0000 mg | ORAL_TABLET | Freq: Three times a day (TID) | ORAL | 0 refills | Status: DC | PRN

## 2022-09-29 MED ORDER — METOCLOPRAMIDE HCL 10 MG PO TABS: 10.0000 mg | ORAL_TABLET | Freq: Three times a day (TID) | ORAL | 1 refills | Status: DC

## 2022-09-29 NOTE — ED Triage Notes (Signed)
Pt states upper abd pain with vomiting that began last night. Pt denies fever, diarrhea.

## 2022-09-29 NOTE — ED Notes (Signed)
See triage note  Presents with some n/v which started last pm around 9p  Denies any fever or diarrhea  States she is having some dull epigastric pain .Marland Kitchen States she had been on Ozempic but was out of this for about 2 months  Recently recently restarted it at the same dose

## 2022-09-29 NOTE — ED Provider Notes (Signed)
[facility] Provider Note    Event Date/Time   First MD Initiated Contact with Patient 09/29/22 [phone]     (approximate)   History   Abdominal Pain   HPI  Nichole Fuller is a 46 y.o. female with history of hypothyroidism, RA, obesity, and remaining history as listed in the EMR presents to the emergency department for treatment and evaluation of epigastric pain that occurs just prior to and after vomiting.  Patient had been taking Ozempic until 3 months ago when her insurance stopped paying for it.  Insurance started paying again and she was able to get the prescription yesterday.  She gave herself her injection at the same dosage she was on before the insurance stopped paying then 5 hours later she began having this pain with vomiting.  She has no fever, body aches, chills, cough, diarrhea.  No persistent abdominal pain.      Physical Exam   Triage Vital Signs: ED Triage Vitals  Enc Vitals Group     BP 09/29/22 0438 (!) 137/98     Pulse Rate 09/29/22 0438 (!) 102     Resp 09/29/22 0438 16     Temp 09/29/22 0438 97.8 F (36.6 C)     Temp Source 09/29/22 0438 Oral     SpO2 09/29/22 0438 96 %     Weight 09/29/22 0439 272 lb (123.4 kg)     Height 09/29/22 0439 5\' 1"  (1.549 m)     Head Circumference --      Peak Flow --      Pain Score 09/29/22 0439 5     Pain Loc --      Pain Edu? --      Excl. in [place]? --     Most recent vital signs: Vitals:   09/29/22 0805 09/29/22 0929  BP: 130/88 130/80  Pulse: 99 88  Resp: 16 16  Temp:  98 F (36.7 C)  SpO2: 97% 98%     General: Awake, no distress.  CV:  Good peripheral perfusion.  Resp:  Normal effort.  Abd:  No distention.  No reproducible tenderness on exam. Other:     ED Results / Procedures / Treatments   Labs (all labs ordered are listed, but only abnormal results are displayed) Labs Reviewed  COMPREHENSIVE METABOLIC PANEL - Abnormal; Notable for the following components:      Result  Value   Glucose, Bld 222 (*)    ALT 64 (*)    All other components within normal limits  CBC - Abnormal; Notable for the following components:   RBC 5.13 (*)    All other components within normal limits  LIPASE, BLOOD     EKG  Not indicated   RADIOLOGY Not indicated   PROCEDURES:  Critical Care performed: No  Procedures   MEDICATIONS ORDERED IN ED: Medications  lactated ringers bolus 1,000 mL (0 mLs Intravenous Stopped 09/29/22 0930)  metoCLOPramide (REGLAN) injection 10 mg (10 mg Intravenous Given 09/29/22 0802)     IMPRESSION / MDM / ASSESSMENT AND PLAN / ED COURSE  I reviewed the triage vital signs and the nursing notes.                              Differential diagnosis includes, but is not limited to, medication effect, gastritis, colitis, GERD  Patient's presentation is most consistent with complicating condition requiring diagnostic work-up  45 year old female presenting to  the emergency department for treatment and evaluation of epigastric pain with vomiting after restarting Ozempic.  Patient states that she was told to start at the same dosage as 3 months ago when the insurance stopped paying.  So, she injected 2.0 mg then about 5 hours later developed epigastric pain with intense nausea and vomiting.   Labs are overall reassuring including lipase.  Plan will be to give her a bolus of lactated Ringer's and infusion of Reglan then reassess.  Fluids and Reglan have controlled patient's symptoms.  She is currently pain-free and not experiencing any nausea.  Plan will be to prescribe the Reglan for her and give her some Zofran for breakthrough nausea.  She is to call her prescribing doctor of the Ozempic and request dosage adjustment.  If her pain returns or if she develops nausea and vomiting that is not responsive to medications she is to return to the emergency department if she is unable to see her primary care provider.      FINAL CLINICAL IMPRESSION(S) /  ED DIAGNOSES   Final diagnoses:  Adverse effect of drug, initial encounter  Nausea and vomiting, unspecified vomiting type     Rx / DC Orders   ED Discharge Orders          Ordered    metoCLOPramide (REGLAN) 10 MG tablet  3 times daily with meals        09/29/22 0924    ondansetron (ZOFRAN-ODT) 4 MG disintegrating tablet  Every 8 hours PRN        09/29/22 2440             Note:  This document was prepared using Dragon voice recognition software and may include unintentional dictation errors.   Chinita Pester, FNP 09/29/22 1500    Pilar Jarvis, MD 09/30/22 [phone]

## 2022-09-29 NOTE — ED Notes (Signed)
Feels better  No vomiting  Resting quietly with eyes closed at present

## 2022-10-23 DIAGNOSIS — Z9884 Bariatric surgery status: Secondary | ICD-10-CM

## 2022-12-30 DIAGNOSIS — N159 Renal tubulo-interstitial disease, unspecified: Secondary | ICD-10-CM

## 2022-12-30 HISTORY — DX: Renal tubulo-interstitial disease, unspecified: N15.9

## 2023-07-06 ENCOUNTER — Emergency Department: Payer: 59

## 2023-07-06 ENCOUNTER — Other Ambulatory Visit: Payer: Self-pay

## 2023-07-06 ENCOUNTER — Inpatient Hospital Stay
Admission: EM | Admit: 2023-07-06 | Discharge: 2023-07-08 | DRG: 694 | Disposition: A | Discharge status: Home / Self Care | Payer: 59 | Attending: Internal Medicine | Admitting: Internal Medicine

## 2023-07-06 DIAGNOSIS — M069 Rheumatoid arthritis, unspecified: Secondary | ICD-10-CM | POA: Diagnosis present

## 2023-07-06 DIAGNOSIS — Z1152 Encounter for screening for COVID-19: Secondary | ICD-10-CM

## 2023-07-06 DIAGNOSIS — R109 Unspecified abdominal pain: Secondary | ICD-10-CM

## 2023-07-06 DIAGNOSIS — E039 Hypothyroidism, unspecified: Secondary | ICD-10-CM | POA: Diagnosis present

## 2023-07-06 DIAGNOSIS — E119 Type 2 diabetes mellitus without complications: Secondary | ICD-10-CM | POA: Diagnosis present

## 2023-07-06 DIAGNOSIS — Z90710 Acquired absence of both cervix and uterus: Secondary | ICD-10-CM

## 2023-07-06 DIAGNOSIS — G43909 Migraine, unspecified, not intractable, without status migrainosus: Secondary | ICD-10-CM | POA: Insufficient documentation

## 2023-07-06 DIAGNOSIS — Z882 Allergy status to sulfonamides status: Secondary | ICD-10-CM

## 2023-07-06 DIAGNOSIS — N1330 Unspecified hydronephrosis: Secondary | ICD-10-CM | POA: Diagnosis not present

## 2023-07-06 DIAGNOSIS — Z91040 Latex allergy status: Secondary | ICD-10-CM

## 2023-07-06 DIAGNOSIS — Z888 Allergy status to other drugs, medicaments and biological substances status: Secondary | ICD-10-CM

## 2023-07-06 DIAGNOSIS — Z9884 Bariatric surgery status: Secondary | ICD-10-CM

## 2023-07-06 DIAGNOSIS — Z79890 Hormone replacement therapy (postmenopausal): Secondary | ICD-10-CM

## 2023-07-06 DIAGNOSIS — Z79899 Other long term (current) drug therapy: Secondary | ICD-10-CM

## 2023-07-06 HISTORY — DX: Calculus of kidney: N20.0

## 2023-07-06 LAB — URINALYSIS, ROUTINE W REFLEX MICROSCOPIC
Bacteria, UA: NONE SEEN
Bilirubin Urine: NEGATIVE
Glucose, UA: NEGATIVE mg/dL
Ketones, ur: 5 mg/dL — AB
Leukocytes,Ua: NEGATIVE
Nitrite: NEGATIVE
Protein, ur: NEGATIVE mg/dL
RBC / HPF: >50 RBC/hpf (ref 0–5)
Specific Gravity, Urine: 1.018 (ref 1.005–1.030)
pH: 5 (ref 5.0–8.0)

## 2023-07-06 LAB — BASIC METABOLIC PANEL
Anion gap: 10 (ref 5–15)
BUN: 15 mg/dL (ref 6–20)
CO2: 20 mmol/L — ABNORMAL LOW (ref 22–32)
Calcium: 9.5 mg/dL (ref 8.9–10.3)
Chloride: 110 mmol/L (ref 98–111)
Creatinine, Ser: 0.86 mg/dL (ref 0.44–1.00)
GFR, Estimated: >60 mL/min (ref >60)
Glucose, Bld: 98 mg/dL (ref 70–99)
Potassium: 3.8 mmol/L (ref 3.5–5.1)
Sodium: 140 mmol/L (ref 135–145)

## 2023-07-06 LAB — CBC
HCT: 46.3 % — ABNORMAL HIGH (ref 36.0–46.0)
Hemoglobin: 15.4 g/dL — ABNORMAL HIGH (ref 12.0–15.0)
MCH: 28.1 pg (ref 26.0–34.0)
MCHC: 33.3 g/dL (ref 30.0–36.0)
MCV: 84.5 fL (ref 80.0–100.0)
Platelets: 372 10*3/uL (ref 150–400)
RBC: 5.48 MIL/uL — ABNORMAL HIGH (ref 3.87–5.11)
RDW: 13.2 % (ref 11.5–15.5)
WBC: 7.3 10*3/uL (ref 4.0–10.5)
nRBC: 0 % (ref 0.0–0.2)

## 2023-07-06 MED ORDER — FENTANYL CITRATE PF 50 MCG/ML IJ SOSY
50.0000 ug | PREFILLED_SYRINGE | INTRAMUSCULAR | Status: AC | PRN
  Administered 2023-07-06 (×2): 50 ug via INTRAVENOUS
  Filled 2023-07-06 (×2): qty 1

## 2023-07-06 MED ORDER — HYDROMORPHONE HCL 1 MG/ML IJ SOLN
1.0000 mg | Freq: Once | INTRAMUSCULAR | Status: AC
  Administered 2023-07-06: 1 mg via INTRAVENOUS
  Filled 2023-07-06: qty 1

## 2023-07-06 MED ORDER — DIPHENHYDRAMINE HCL 50 MG/ML IJ SOLN
25.0000 mg | Freq: Once | INTRAMUSCULAR | Status: AC
  Administered 2023-07-06: 25 mg via INTRAVENOUS
  Filled 2023-07-06: qty 1

## 2023-07-06 MED ORDER — KETOROLAC TROMETHAMINE 15 MG/ML IJ SOLN
15.0000 mg | Freq: Once | INTRAMUSCULAR | Status: AC
  Administered 2023-07-06: 15 mg via INTRAVENOUS
  Filled 2023-07-06: qty 1

## 2023-07-06 MED ORDER — OXYCODONE-ACETAMINOPHEN 5-325 MG PO TABS
1.0000 | ORAL_TABLET | Freq: Once | ORAL | Status: AC
  Administered 2023-07-06: 1 via ORAL
  Filled 2023-07-06: qty 1

## 2023-07-06 MED ORDER — SODIUM CHLORIDE 0.9 % IV BOLUS
1000.0000 mL | Freq: Once | INTRAVENOUS | Status: AC
  Administered 2023-07-06: 1000 mL via INTRAVENOUS

## 2023-07-06 MED ORDER — ONDANSETRON HCL 4 MG/2ML IJ SOLN
4.0000 mg | Freq: Once | INTRAMUSCULAR | Status: AC
  Administered 2023-07-06: 4 mg via INTRAVENOUS

## 2023-07-06 MED ORDER — IBUPROFEN 600 MG PO TABS: 600.0000 mg | ORAL_TABLET | Freq: Four times a day (QID) | ORAL | 0 refills | Status: DC | PRN

## 2023-07-06 MED ORDER — OXYCODONE-ACETAMINOPHEN 5-325 MG PO TABS: 1.0000 | ORAL_TABLET | ORAL | 0 refills | Status: DC | PRN

## 2023-07-06 NOTE — ED Triage Notes (Addendum)
Pt to ED for severe R flank pain radiating to groin with nausea since 2 hours ago. Hx kidney infx 1 month ago to R kidney. States "it feels like I'm giving birth to a pair of scissors". Pt unable to sit still, rocking in chair. States hasn't felt right since kindey infx. Also hx ovarian  cysts. Appears to be in severe pain. Pt diaphoretic.

## 2023-07-06 NOTE — Discharge Instructions (Signed)
Take the pain medication as needed.  Follow-up with the urologist within the next week.  The CT scan showed hydronephrosis which is swelling of the kidney from a urine blockage.  This is usually due to a stone.  This will need to be followed up with an ultrasound of your kidney to make sure that it resolves.  Return to the ER immediately for new, worsening, or persistent severe flank or abdominal pain, vomiting, fever, weakness, or any other new or worsening symptoms that concern you.

## 2023-07-06 NOTE — ED Provider Notes (Signed)
[facility] Provider Note    Event Date/Time   First MD Initiated Contact with Patient 07/06/23 1753     (approximate)   History   Flank Pain   HPI  Nichole Fuller is a 47 y.o. female with history of hypothyroidism, RA, obesity, and remote history of kidney stones who presents with right flank pain, acute onset 2 hours ago, severe intensity, radiating around to the right lower quadrant, and associated with nausea.  It is sharp in quality.  The patient states she had a kidney infection about a month ago and states that she has had some discomfort since that time but not like this.  The patient denies any fever or chills.  She has no change in her bowel movements.  I reviewed the past medical records.  The patient was seen in internal medicine on 5/19 for left flank pain and was treated with [facility] for a kidney infection.   Physical Exam   Triage Vital Signs: ED Triage Vitals  Enc Vitals Group     BP 07/06/23 1717 (!) 148/115     Pulse Rate 07/06/23 1715 90     Resp 07/06/23 1715 (!) 24     Temp 07/06/23 1715 98.3 F (36.8 C)     Temp Source 07/06/23 1715 Oral     SpO2 07/06/23 1715 100 %     Weight 07/06/23 1716 193 lb (87.5 kg)     Height 07/06/23 1716 5\' 1"  (1.549 m)     Head Circumference --      Peak Flow --      Pain Score 07/06/23 1716 10     Pain Loc --      Pain Edu? --      Excl. in [place]? --     Most recent vital signs: Vitals:   07/06/23 1928 07/06/23 1935  BP: 104/70 108/68  Pulse: (!) 57 (!) 56  Resp: 19 16  Temp:    SpO2: 96%      General: Alert, uncomfortable appearing. CV:  Good peripheral perfusion.  Resp:  Normal effort.  Abd:  Soft and nontender.  No distention.  Other:  No CVA tenderness.   ED Results / Procedures / Treatments   Labs (all labs ordered are listed, but only abnormal results are displayed) Labs Reviewed  BASIC METABOLIC PANEL - Abnormal; Notable for the following components:      Result Value    CO2 20 (*)    All other components within normal limits  CBC - Abnormal; Notable for the following components:   RBC 5.48 (*)    Hemoglobin 15.4 (*)    HCT 46.3 (*)    All other components within normal limits  URINALYSIS, ROUTINE W REFLEX MICROSCOPIC     EKG     RADIOLOGY  CT abdomen/pelvis: I independently viewed and interpreted the images; there are no dilated bowel loops or any free air or free fluid.  Radiology report indicates hydronephrosis and hydroureter with no visible stone.  PROCEDURES:  Critical Care performed: No  Procedures   MEDICATIONS ORDERED IN ED: Medications  fentaNYL (SUBLIMAZE) injection 50 mcg (50 mcg Intravenous Given 07/06/23 1726)  oxyCODONE-acetaminophen (PERCOCET/ROXICET) 5-325 MG per tablet 1 tablet (has no administration in time range)  ondansetron (ZOFRAN) injection 4 mg (4 mg Intravenous Given 07/06/23 1726)  sodium chloride 0.9 % bolus 1,000 mL (0 mLs Intravenous Stopped 07/06/23 1936)  HYDROmorphone (DILAUDID) injection 1 mg (1 mg Intravenous Given 07/06/23 1818)  ketorolac (TORADOL) 15 MG/ML injection 15 mg (15 mg Intravenous Given 07/06/23 1816)  diphenhydrAMINE (BENADRYL) injection 25 mg (25 mg Intravenous Given 07/06/23 1834)     IMPRESSION / MDM / ASSESSMENT AND PLAN / ED COURSE  I reviewed the triage vital signs and the nursing notes.  47 year old female with PMH as noted above presents with acute onset of right flank pain.  On exam the patient is uncomfortable appearing but has no abdominal tenderness.  Differential diagnosis includes, but is not limited to, ureteral stone, pyelonephritis, musculoskeletal pain, colitis, diverticulitis.  Initial labs are reassuring.  Creatinine is normal.  There is no leukocytosis or anemia.  We will obtain CT, UA, give analgesia and fluids and reassess.  Patient's presentation is most consistent with acute complicated illness / injury requiring diagnostic  workup.  ----------------------------------------- 6:40 PM on 07/06/2023 -----------------------------------------  CT shows hydronephrosis and hydroureter but no stone, consistent with a stone that has recently passed.  The patient reports improved pain with the Toradol and Dilaudid, however shortly after getting the medications the patient started having total body shaking.  On my assessment, the patient was turning her head back-and-forth, contracting the muscles in her arms and legs, and shaking in the bed.  She was alert and able to answer questions.   This appears most consistent with a dystonic reaction although the patient is not on any psychiatric medications and neither Toradol nor Dilaudid should cause this type of reaction.  There is no evidence of a seizure.  The patient denies any prior history of similar reactions to medications.  I ordered IV Benadryl.  The movements are slowing down.  We will continue to monitor.  ----------------------------------------- 8:01 PM on 07/06/2023 -----------------------------------------  The movements have almost completely resolved.  The patient remains alert and oriented and just has mild intermittent muscle spasms.  She states the pain is starting to come back although is not as severe as before.  I have ordered a p.o. Percocet.  I consulted and discussed case with Dr. Mena Goes from urology.  He advises that the hydronephrosis is likely due to a passed stone but could also be due to a stricture.  He advises that since the patient's creatinine is normal as long as her pain is well-controlled she is appropriate for discharge with close outpatient follow-up either with primary care or urology for a renal ultrasound to make sure that the hydronephrosis resolves.  We will reassess after additional pain medication.  I will sign the patient out to the oncoming ED provider.   FINAL CLINICAL IMPRESSION(S) / ED DIAGNOSES   Final diagnoses:   Hydronephrosis, unspecified hydronephrosis type  Right flank pain     Rx / DC Orders   ED Discharge Orders          Ordered    oxyCODONE-acetaminophen (PERCOCET) 5-325 MG tablet  Every 4 hours PRN        07/06/23 1922    ibuprofen (ADVIL) 600 MG tablet  Every 6 hours PRN        07/06/23 1922             Note:  This document was prepared using Dragon voice recognition software and may include unintentional dictation errors.    Dionne Bucy, MD 07/06/23 2002

## 2023-07-06 NOTE — ED Notes (Signed)
Patient continues to have tremors at this time. Patient reports that tremors began after administration of Toradol and dilaudid.

## 2023-07-07 ENCOUNTER — Inpatient Hospital Stay: Payer: 59

## 2023-07-07 DIAGNOSIS — R109 Unspecified abdominal pain: Secondary | ICD-10-CM

## 2023-07-07 DIAGNOSIS — Z1152 Encounter for screening for COVID-19: Secondary | ICD-10-CM | POA: Diagnosis not present

## 2023-07-07 DIAGNOSIS — Z90710 Acquired absence of both cervix and uterus: Secondary | ICD-10-CM | POA: Diagnosis not present

## 2023-07-07 DIAGNOSIS — G43909 Migraine, unspecified, not intractable, without status migrainosus: Secondary | ICD-10-CM | POA: Insufficient documentation

## 2023-07-07 DIAGNOSIS — E039 Hypothyroidism, unspecified: Secondary | ICD-10-CM

## 2023-07-07 DIAGNOSIS — N1330 Unspecified hydronephrosis: Secondary | ICD-10-CM

## 2023-07-07 DIAGNOSIS — E119 Type 2 diabetes mellitus without complications: Secondary | ICD-10-CM | POA: Diagnosis present

## 2023-07-07 DIAGNOSIS — Z79890 Hormone replacement therapy (postmenopausal): Secondary | ICD-10-CM | POA: Diagnosis not present

## 2023-07-07 DIAGNOSIS — M069 Rheumatoid arthritis, unspecified: Secondary | ICD-10-CM | POA: Diagnosis present

## 2023-07-07 DIAGNOSIS — Z9884 Bariatric surgery status: Secondary | ICD-10-CM | POA: Diagnosis not present

## 2023-07-07 DIAGNOSIS — Z79899 Other long term (current) drug therapy: Secondary | ICD-10-CM | POA: Diagnosis not present

## 2023-07-07 DIAGNOSIS — Z91040 Latex allergy status: Secondary | ICD-10-CM | POA: Diagnosis not present

## 2023-07-07 DIAGNOSIS — Z882 Allergy status to sulfonamides status: Secondary | ICD-10-CM | POA: Diagnosis not present

## 2023-07-07 DIAGNOSIS — Z888 Allergy status to other drugs, medicaments and biological substances status: Secondary | ICD-10-CM | POA: Diagnosis not present

## 2023-07-07 LAB — CREATININE, SERUM
Creatinine, Ser: 0.83 mg/dL (ref 0.44–1.00)
GFR, Estimated: >60 mL/min (ref >60)

## 2023-07-07 LAB — CBC
HCT: 38.3 % (ref 36.0–46.0)
Hemoglobin: 12.4 g/dL (ref 12.0–15.0)
MCH: 27.9 pg (ref 26.0–34.0)
MCHC: 32.4 g/dL (ref 30.0–36.0)
MCV: 86.3 fL (ref 80.0–100.0)
Platelets: 255 10*3/uL (ref 150–400)
RBC: 4.44 MIL/uL (ref 3.87–5.11)
RDW: 13.4 % (ref 11.5–15.5)
WBC: 5.2 10*3/uL (ref 4.0–10.5)
nRBC: 1 % — ABNORMAL HIGH (ref 0.0–0.2)

## 2023-07-07 LAB — HIV ANTIBODY (ROUTINE TESTING W REFLEX): HIV Screen 4th Generation wRfx: NONREACTIVE

## 2023-07-07 LAB — GLUCOSE, CAPILLARY: Glucose-Capillary: 93 mg/dL (ref 70–99)

## 2023-07-07 MED ORDER — ORAL CARE MOUTH RINSE: 15.0000 mL | OROMUCOSAL | Status: DC | PRN

## 2023-07-07 MED ORDER — SODIUM CHLORIDE 0.9 % IV BOLUS
1000.0000 mL | Freq: Once | INTRAVENOUS | Status: AC
  Administered 2023-07-07: 1000 mL via INTRAVENOUS

## 2023-07-07 MED ORDER — HYDROMORPHONE HCL 1 MG/ML IJ SOLN
0.5000 mg | INTRAMUSCULAR | Status: AC | PRN
  Administered 2023-07-07 (×2): 0.5 mg via INTRAVENOUS
  Filled 2023-07-07 (×2): qty 0.5

## 2023-07-07 MED ORDER — ACETAMINOPHEN 325 MG PO TABS: 650.0000 mg | ORAL_TABLET | Freq: Four times a day (QID) | ORAL | Status: DC | PRN

## 2023-07-07 MED ORDER — HYDROMORPHONE HCL 1 MG/ML IJ SOLN
1.0000 mg | INTRAMUSCULAR | Status: DC | PRN
  Administered 2023-07-07 (×3): 1 mg via INTRAVENOUS
  Filled 2023-07-07 (×3): qty 1

## 2023-07-07 MED ORDER — HYDROCODONE-ACETAMINOPHEN 5-325 MG PO TABS
1.0000 | ORAL_TABLET | ORAL | Status: DC | PRN
  Administered 2023-07-07 – 2023-07-08 (×4): 2 via ORAL
  Filled 2023-07-07 (×4): qty 2

## 2023-07-07 MED ORDER — KETOROLAC TROMETHAMINE 30 MG/ML IJ SOLN: 30.0000 mg | Freq: Four times a day (QID) | INTRAMUSCULAR | Status: DC | PRN

## 2023-07-07 MED ORDER — LEVOTHYROXINE SODIUM 50 MCG PO TABS
125.0000 ug | ORAL_TABLET | Freq: Every day | ORAL | Status: DC
  Administered 2023-07-07 – 2023-07-08 (×2): 125 ug via ORAL
  Filled 2023-07-07 (×2): qty 1

## 2023-07-07 MED ORDER — ACETAMINOPHEN 650 MG RE SUPP: 650.0000 mg | Freq: Four times a day (QID) | RECTAL | Status: DC | PRN

## 2023-07-07 MED ORDER — HYDROMORPHONE HCL 1 MG/ML IJ SOLN
1.0000 mg | Freq: Once | INTRAMUSCULAR | Status: AC
  Administered 2023-07-07: 1 mg via INTRAVENOUS
  Filled 2023-07-07: qty 1

## 2023-07-07 MED ORDER — ONDANSETRON HCL 4 MG PO TABS
4.0000 mg | ORAL_TABLET | Freq: Four times a day (QID) | ORAL | Status: DC | PRN
  Administered 2023-07-08: 4 mg via ORAL
  Filled 2023-07-07: qty 1

## 2023-07-07 MED ORDER — ONDANSETRON HCL 4 MG/2ML IJ SOLN
4.0000 mg | Freq: Four times a day (QID) | INTRAMUSCULAR | Status: DC | PRN
  Administered 2023-07-07: 4 mg via INTRAVENOUS
  Filled 2023-07-07: qty 2

## 2023-07-07 MED ORDER — ENOXAPARIN SODIUM 60 MG/0.6ML IJ SOSY
0.5000 mg/kg | PREFILLED_SYRINGE | INTRAMUSCULAR | Status: DC
  Administered 2023-07-07 – 2023-07-08 (×2): 45 mg via SUBCUTANEOUS
  Filled 2023-07-07 (×2): qty 0.6

## 2023-07-07 NOTE — Assessment & Plan Note (Signed)
Continue Lyrica. 

## 2023-07-07 NOTE — Assessment & Plan Note (Signed)
Severe right hydronephrosis on CT without obstructive stone stone Patient with chronic flank pain x 2 months Pain control Urology consult

## 2023-07-07 NOTE — ED Provider Notes (Signed)
----------------------------------------- 12:04 AM on 07/07/2023 -----------------------------------------  Blood pressure 109/76, pulse 71, temperature 98.3 F (36.8 C), temperature source Oral, resp. rate 16, height 5\' 1"  (1.549 m), weight 87.5 kg, SpO2 100 %.  Assuming care from Dr. Marisa Severin, PA-C/NP-C.  In short, Nichole Fuller is a 47 y.o. female with a chief complaint of Flank Pain .  Refer to the original H&P for additional details.  The current plan of care is to reevaluate the patient intermittently for pain control.  Consider admission for pain management if symptoms or not managed with IV meds within the next 2 hours.  ____________________________________________    ED Results / Procedures / Treatments   Labs (all labs ordered are listed, but only abnormal results are displayed) Labs Reviewed  URINALYSIS, ROUTINE W REFLEX MICROSCOPIC - Abnormal; Notable for the following components:      Result Value   Color, Urine YELLOW (*)    APPearance CLOUDY (*)    Hgb urine dipstick LARGE (*)    Ketones, ur 5 (*)    All other components within normal limits  BASIC METABOLIC PANEL - Abnormal; Notable for the following components:   CO2 20 (*)    All other components within normal limits  CBC - Abnormal; Notable for the following components:   RBC 5.48 (*)    Hemoglobin 15.4 (*)    HCT 46.3 (*)    All other components within normal limits   EKG   RADIOLOGY  I personally viewed and evaluated these images as part of my medical decision making, as well as reviewing the written report by the radiologist.  ED Provider Interpretation: Right hydronephrosis  CT Renal Stone Study  Result Date: 07/06/2023 CLINICAL DATA:  Right flank pain EXAM: CT ABDOMEN AND PELVIS WITHOUT CONTRAST TECHNIQUE: Multidetector CT imaging of the abdomen and pelvis was performed following the standard protocol without IV contrast. RADIATION DOSE REDUCTION: This exam was performed according to the  departmental dose-optimization program which includes automated exposure control, adjustment of the mA and/or kV according to patient size and/or use of iterative reconstruction technique. COMPARISON:  CT abdomen and pelvis dated September 17, 2021 FINDINGS: Lower chest: No acute abnormality. Hepatobiliary: No focal liver abnormality is seen. No gallstones, gallbladder wall thickening, or biliary dilatation. Pancreas: Unremarkable. No pancreatic ductal dilatation or surrounding inflammatory changes. Spleen: Normal in size without focal abnormality. Adrenals/Urinary Tract: Bilateral adrenal glands are unremarkable. Severe right hydronephrosis and mild hydroureter. Normal appearance of the left kidney. Bladder is decompressed. Stomach/Bowel: Prior Roux-en-Y gastrectomy. No evidence of bowel wall thickening, distention, or inflammatory changes. Vascular/Lymphatic: Aortic atherosclerosis. No enlarged abdominal or pelvic lymph nodes. Reproductive: Bilateral adnexa are unremarkable. Other: No abdominal wall hernia or abnormality. No abdominopelvic ascites. Musculoskeletal: No acute or significant osseous findings. IMPRESSION: 1. Severe right hydronephrosis and mild hydroureter. No obstructing stone visualized. Findings are likely due to recently passed stone. 2. Aortic Atherosclerosis (ICD10-I70.0). Electronically Signed   By: Allegra Lai M.D.   On: 07/06/2023 17:59     PROCEDURES:  Critical Care performed: No  Procedures   MEDICATIONS ORDERED IN ED: Medications  fentaNYL (SUBLIMAZE) injection 50 mcg (50 mcg Intravenous Given 07/06/23 2315)  ondansetron (ZOFRAN) injection 4 mg (4 mg Intravenous Given 07/06/23 1726)  sodium chloride 0.9 % bolus 1,000 mL (0 mLs Intravenous Stopped 07/06/23 1936)  HYDROmorphone (DILAUDID) injection 1 mg (1 mg Intravenous Given 07/06/23 1818)  ketorolac (TORADOL) 15 MG/ML injection 15 mg (15 mg Intravenous Given 07/06/23 1816)  diphenhydrAMINE (BENADRYL) injection 25  mg (25 mg  Intravenous Given 07/06/23 1834)  oxyCODONE-acetaminophen (PERCOCET/ROXICET) 5-325 MG per tablet 1 tablet (1 tablet Oral Given 07/06/23 2009)     IMPRESSION / MDM / ASSESSMENT AND PLAN / ED COURSE  I reviewed the triage vital signs and the nursing notes.                              Differential diagnosis includes, but is not limited to, pyelonephritis, hydronephrosis, renal colic, nephrolithiasis, utero lithiasis  Patient's presentation is most consistent with acute complicated illness / injury requiring diagnostic workup.  ----------------------------------------- 12:05 AM on 07/07/2023 ----------------------------------------- Interim evaluation of the patient after the second 50 mcg of fentanyl was provided.  Patient endorsed no improvement of her pain.  I did advise her that treatment plan discussed with her was to consider admission for pain control if she did not feel comfortable going home.  Patient initially agreeable to admission for pain management.  I did ask her in detail if she tolerated the Dilaudid better and she claimed that she did.  She be willing to try an additional dose of Dilaudid and go home after that if pain was well-controlled.  ----------------------------------------- 12:34 AM on 07/07/2023 ----------------------------------------- Patient voices concerns to the nurse following second dose of Dilaudid about continued oliguria and urinary urgency.  Patient voices concern about ongoing pain control when she leaves the hospital.  She is at this time now requesting admission for pain control.  I have paged the hospitalist for admission and transfer care at this time to my attending.  Patient's diagnosis is consistent with right hydronephrosis. Patient will be discharged home with prescriptions for ibuprofen and oxycodone. Patient is to follow up with urology as needed or otherwise directed. Patient is given ED precautions to return to the ED for any worsening or new  symptoms.     FINAL CLINICAL IMPRESSION(S) / ED DIAGNOSES   Final diagnoses:  Hydronephrosis, unspecified hydronephrosis type  Right flank pain     Rx / DC Orders   ED Discharge Orders          Ordered    oxyCODONE-acetaminophen (PERCOCET) 5-325 MG tablet  Every 4 hours PRN        07/06/23 1922    ibuprofen (ADVIL) 600 MG tablet  Every 6 hours PRN        07/06/23 1922             Note:  This document was prepared using Dragon voice recognition software and may include unintentional dictation errors.    Lissa Hoard, PA-C 07/07/23 1610    Dionne Bucy, MD 07/07/23 2252

## 2023-07-07 NOTE — Assessment & Plan Note (Signed)
No acute disused

## 2023-07-07 NOTE — ED Provider Notes (Addendum)
Care assumed of patient from outgoing provider.  See their note for initial history, exam and plan.  Clinical Course as of 07/07/23 0042  Mon Jul 07, 2023  0040 Currently waiting for hospitalist callback for admission.  Patient presented with flank pain that was severe.  The CT scan with hydronephrosis but no obvious kidney stone or obstructive process.  Concern for possible passed kidney stone.  UA without signs of urinary tract infection.  Creatinine is at her baseline.  Multiple doses of IV pain medication.  Given multiple doses of IV Dilaudid and fentanyl.  Continues to have ongoing pain so have paged the hospitalist for pain management for hydronephrosis. [SM]    Clinical Course User Index [SM] Corena Herter, MD   Continue to have severe pain.  Given another dose of IV pain medication.  Discussed patient's case with the hospitalist Corena Herter, MD 07/07/23 1610    Corena Herter, MD 07/07/23 [phone]

## 2023-07-07 NOTE — Progress Notes (Signed)
Anticoagulation monitoring(Lovenox):  47 yo female ordered Lovenox 40 mg Q24h    Filed Weights   07/06/23 1716  Weight: 87.5 kg (193 lb)   BMI  36.5  Lab Results  Component Value Date   CREATININE 0.86 07/06/2023   CREATININE 0.72 09/29/2022   CREATININE 0.87 09/17/2021   Estimated Creatinine Clearance: 82.2 mL/min (by C-G formula based on SCr of 0.86 mg/dL). Hemoglobin & Hematocrit     Component Value Date/Time   HGB 15.4 (H) 07/06/2023 1719   HGB 13.1 12/24/2014 2151   HCT 46.3 (H) 07/06/2023 1719   HCT 40.1 12/24/2014 2151     Per Protocol for Patient with estCrcl > 30 ml/min and BMI > 30, will transition to Lovenox 45 mg Q24h.

## 2023-07-07 NOTE — Assessment & Plan Note (Signed)
Continue levothyroxine 

## 2023-07-07 NOTE — H&P (Signed)
History and Physical    Patient: Nichole Fuller [id number] DOB: 1976/05/29 DOA: 07/06/2023 DOS: the patient was seen and examined on 07/07/2023 PCP: [facility]  Patient coming from: Home  Chief Complaint:  Chief Complaint  Patient presents with   Flank Pain    HPI: Nichole Fuller is a 47 y.o. female with medical history significant for obesity s/p gastric bypass 09/2022, joint issues, hypothyroid, migraines, DM and remote h/o nephrolithiasis , who presents to the ED with a complaint of right flank pain that started at around 2:30 PM on 07/06/2023.  She has no nausea or vomiting or diarrhea or dysuria..  Has a history of left flank pain back in May and workup revealed an abnormality concerning for renal infarct on CT scan.  She followed up with nephrology  who did not think her flank pain correlated with the abnormal finding on CT.  She was treated for UTI with improvement.  The pain today is on the right. ED course and data review: Vitals within normal limits, labs including UA for the most part unremarkable.Neck CT renal stone study showed severe right hydronephrosis but without obstructing stone as follows IMPRESSION: 1. Severe right hydronephrosis and mild hydroureter. No obstructing stone visualized. Findings are likely due to recently passed stone.  Patient treated with several rounds of Dilaudid and also given Benadryl, Percocet but continued to have intractable pain Hospitalist consulted for pain control   Review of Systems: As mentioned in the history of present illness. All other systems reviewed and are negative.  Past Medical History:  Diagnosis Date   Hypothyroid    Kidney infection 2024   Kidney stone    years ago   Rheumatoid arthritis(714.0)    Past Surgical History:  Procedure Laterality Date   ABDOMINAL HYSTERECTOMY     APPENDECTOMY     OVARIAN CYST SURGERY     Social History:  reports that she has never smoked. She has never used smokeless  tobacco. She reports that she does not drink alcohol and does not use drugs.  Allergies  Allergen Reactions   Bupivacaine Liposome Shortness Of Breath   Latex Hives, Itching, Rash and Swelling   Lisinopril Swelling and Other (See Comments)   Nsaids Other (See Comments)    Cannot have after gastric bypass   Semaglutide Nausea And Vomiting   Promethazine Other (See Comments)    Muscle spasms   Sulfa Antibiotics Rash and Other (See Comments)    History reviewed. No pertinent family history.  Prior to Admission medications   Medication Sig Start Date End Date Taking? Authorizing Provider  ibuprofen (ADVIL) 600 MG tablet Take 1 tablet (600 mg total) by mouth every 6 (six) hours as needed. 07/06/23  Yes Dionne Bucy, MD  oxyCODONE-acetaminophen (PERCOCET) 5-325 MG tablet Take 1 tablet by mouth every 4 (four) hours as needed for up to 5 days for severe pain. 07/06/23 07/11/23 Yes Dionne Bucy, MD  pregabalin (LYRICA) 25 MG capsule Take 25 mg by mouth as directed. Take 25mg  in the morning and 75mg  at night. 03/17/23  Yes [provider]  XELJANZ XR 11 MG TB24 Take 11 mg by mouth daily. 09/02/19  Yes [provider]  busPIRone (BUSPAR) 10 MG tablet Take 10 mg by mouth 3 (three) times daily.    [provider]  gabapentin (NEURONTIN) 300 MG capsule Take 300 mg by mouth 3 (three) times daily.    [provider]  levothyroxine (SYNTHROID) 125 MCG tablet Take 125  mcg by mouth daily before breakfast.    [provider]  metoCLOPramide (REGLAN) 10 MG tablet Take 1 tablet (10 mg total) by mouth 3 (three) times daily with meals. 09/29/22 09/29/23  Triplett, Rulon Eisenmenger B, FNP  ondansetron (ZOFRAN-ODT) 4 MG disintegrating tablet Take 1 tablet (4 mg total) by mouth every 8 (eight) hours as needed for nausea or vomiting. 09/29/22   Triplett, Cari B, FNP  pantoprazole (PROTONIX) 40 MG tablet Take 1 tablet (40 mg total) by mouth daily. 09/17/21 09/17/22  Minna Antis, MD  phentermine (ADIPEX-P) 37.5 MG tablet Take 1 tablet by mouth daily before breakfast. 05/14/23   [provider]  Semaglutide (OZEMPIC, 1 MG/DOSE, [place]) Inject into the skin. Patient not taking: Reported on 07/07/2023    [provider]  sucralfate (CARAFATE) 1 g tablet Take 1 tablet (1 g total) by mouth 4 (four) times daily. 09/17/21 09/17/22  Minna Antis, MD    Physical Exam: Vitals:   07/06/23 1928 07/06/23 1935 07/06/23 2314 07/07/23 0021  BP: 104/70 108/68 109/76 126/81  Pulse: (!) 57 (!) 56 71 60  Resp: 19 16 16 18   Temp:      TempSrc:      SpO2: 96%  100% 100%  Weight:      Height:       Physical Exam Vitals and nursing note reviewed.  Constitutional:      General: She is not in acute distress. HENT:     Head: Normocephalic and atraumatic.  Cardiovascular:     Rate and Rhythm: Normal rate and regular rhythm.     Heart sounds: Normal heart sounds.  Pulmonary:     Effort: Pulmonary effort is normal.     Breath sounds: Normal breath sounds.  Abdominal:     Palpations: Abdomen is soft.     Tenderness: There is no abdominal tenderness. There is right CVA tenderness.  Neurological:     Mental Status: Mental status is at baseline.     Labs on Admission: I have personally reviewed following labs and imaging studies  CBC: Recent Labs  Lab 07/06/23 1719  WBC 7.3  HGB 15.4*  HCT 46.3*  MCV 84.5  PLT 372   Basic Metabolic Panel: Recent Labs  Lab 07/06/23 1719  NA 140  K 3.8  CL 110  CO2 20*  GLUCOSE 98  BUN 15  CREATININE 0.86  CALCIUM 9.5   GFR: Estimated Creatinine Clearance: 82.2 mL/min (by C-G formula based on SCr of 0.86 mg/dL). Liver Function Tests: No results for input(s): "AST", "ALT", "ALKPHOS", "BILITOT", "PROT", "ALBUMIN" in the last 168 hours. No results for input(s): "LIPASE", "AMYLASE" in the last 168 hours. No results for input(s): "AMMONIA" in the last 168 hours. Coagulation Profile: No results for input(s):  "INR", "PROTIME" in the last 168 hours. Cardiac Enzymes: No results for input(s): "CKTOTAL", "CKMB", "CKMBINDEX", "TROPONINI" in the last 168 hours. BNP (last 3 results) No results for input(s): "PROBNP" in the last 8760 hours. HbA1C: No results for input(s): "HGBA1C" in the last 72 hours. CBG: No results for input(s): "GLUCAP" in the last 168 hours. Lipid Profile: No results for input(s): "CHOL", "HDL", "LDLCALC", "TRIG", "CHOLHDL", "LDLDIRECT" in the last 72 hours. Thyroid Function Tests: No results for input(s): "TSH", "T4TOTAL", "FREET4", "T3FREE", "THYROIDAB" in the last 72 hours. Anemia Panel: No results for input(s): "VITAMINB12", "FOLATE", "FERRITIN", "TIBC", "IRON", "RETICCTPCT" in the last 72 hours. Urine analysis:    Component Value Date/Time   COLORURINE YELLOW (A) 07/06/2023 2128  APPEARANCEUR CLOUDY (A) 07/06/2023 2128   APPEARANCEUR Hazy 12/26/2014 0249   LABSPEC 1.018 07/06/2023 2128   LABSPEC 1.026 12/26/2014 0249   PHURINE 5.0 07/06/2023 2128   GLUCOSEU NEGATIVE 07/06/2023 2128   GLUCOSEU Negative 12/26/2014 0249   HGBUR LARGE (A) 07/06/2023 2128   BILIRUBINUR NEGATIVE 07/06/2023 2128   BILIRUBINUR Negative 12/26/2014 0249   KETONESUR 5 (A) 07/06/2023 2128   PROTEINUR NEGATIVE 07/06/2023 2128   UROBILINOGEN 0.2 08/05/2012 0102   NITRITE NEGATIVE 07/06/2023 2128   LEUKOCYTESUR NEGATIVE 07/06/2023 2128   LEUKOCYTESUR Negative 12/26/2014 0249    Radiological Exams on Admission: CT Renal Stone Study  Result Date: 07/06/2023 CLINICAL DATA:  Right flank pain EXAM: CT ABDOMEN AND PELVIS WITHOUT CONTRAST TECHNIQUE: Multidetector CT imaging of the abdomen and pelvis was performed following the standard protocol without IV contrast. RADIATION DOSE REDUCTION: This exam was performed according to the departmental dose-optimization program which includes automated exposure control, adjustment of the mA and/or kV according to patient size and/or use of iterative  reconstruction technique. COMPARISON:  CT abdomen and pelvis dated September 17, 2021 FINDINGS: Lower chest: No acute abnormality. Hepatobiliary: No focal liver abnormality is seen. No gallstones, gallbladder wall thickening, or biliary dilatation. Pancreas: Unremarkable. No pancreatic ductal dilatation or surrounding inflammatory changes. Spleen: Normal in size without focal abnormality. Adrenals/Urinary Tract: Bilateral adrenal glands are unremarkable. Severe right hydronephrosis and mild hydroureter. Normal appearance of the left kidney. Bladder is decompressed. Stomach/Bowel: Prior Roux-en-Y gastrectomy. No evidence of bowel wall thickening, distention, or inflammatory changes. Vascular/Lymphatic: Aortic atherosclerosis. No enlarged abdominal or pelvic lymph nodes. Reproductive: Bilateral adnexa are unremarkable. Other: No abdominal wall hernia or abnormality. No abdominopelvic ascites. Musculoskeletal: No acute or significant osseous findings. IMPRESSION: 1. Severe right hydronephrosis and mild hydroureter. No obstructing stone visualized. Findings are likely due to recently passed stone. 2. Aortic Atherosclerosis (ICD10-I70.0). Electronically Signed   By: Allegra Lai M.D.   On: 07/06/2023 17:59     Data Reviewed: Relevant notes from primary care and specialist visits, past discharge summaries as available in EHR, including Care Everywhere. Prior diagnostic testing as pertinent to current admission diagnoses Updated medications and problem lists for reconciliation ED course, including vitals, labs, imaging, treatment and response to treatment Triage notes, nursing and pharmacy notes and ED provider's notes Notable results as noted in HPI   Assessment and Plan: * Right flank pain Severe right hydronephrosis on CT without obstructive stone stone Patient with chronic flank pain x 2 months Pain control Urology consult  Migraines Continue Lyrica  S/P gastric bypass 09/2022 No acute  disused  Acquired hypothyroidism Continue levothyroxine     DVT prophylaxis: Lovenox  Consults: Urology, Dr. Mena Goes  Advance Care Planning: full code  Family Communication: none  Disposition Plan: Back to previous home environment  Severity of Illness: The appropriate patient status for this patient is INPATIENT. Inpatient status is judged to be reasonable and necessary in order to provide the required intensity of service to ensure the patient's safety. The patient's presenting symptoms, physical exam findings, and initial radiographic and laboratory data in the context of their chronic comorbidities is felt to place them at high risk for further clinical deterioration. Furthermore, it is not anticipated that the patient will be medically stable for discharge from the hospital within 2 midnights of admission.   * I certify that at the point of admission it is my clinical judgment that the patient will require inpatient hospital care spanning beyond 2 midnights from the point of admission  due to high intensity of service, high risk for further deterioration and high frequency of surveillance required.*  Author: Andris Baumann, MD 07/07/2023 4:33 AM  For on call review [email or web address].

## 2023-07-07 NOTE — Consult Note (Signed)
Urology Consult  I have been asked to see the patient by Dr. Para March, for evaluation and management of hydronephrosis.  Chief Complaint: Right flank pain  History of Present Illness: Nichole Fuller is a 47 y.o. year old female with PMH gastric bypass and nephrolithiasis who presented to the ED yesterday with reports of right flank pain.  CT stone study revealed moderate right hydroureteronephrosis to the level of the bladder without obstructing stone.  Admission labs notable for UA with >50 RBCs/hpf, 6-10 WBCs/hpf, no nitrites, no bacteria, and calcium oxalate crystals; WBC count has been WNL, 5.2 this morning; creatinine has been at baseline, 0.83 this morning.  She has been afebrile, VSS.  Today she reports stable to slightly improved right flank pain since arrival.  She reports an approximate 1 month history of alternating right and left flank pain exacerbated by increased p.o. hydration that started after a kidney infection about 1 month ago.  She states that her flank pain tends to be accompanied by periods of increased urinary urgency, frequency, and the sensation of incomplete voiding.    Per chart review, it appears she is referring to an episode of left flank pain that was evaluated with CT AP with contrast at [facility] on 06/15/2023, notable for scattered linear and wedge-shaped hypoattenuating foci throughout the bilateral kidneys suggestive of infarct versus UTI.  She had been treated for UTI at that time.  Nephrology saw her and felt that the findings were likely not consistent with infarct given stable renal function.  She denies past urologic procedures.  Past Medical History:  Diagnosis Date   Hypothyroid    Kidney infection 2024   Kidney stone    years ago   Rheumatoid arthritis(714.0)     Past Surgical History:  Procedure Laterality Date   ABDOMINAL HYSTERECTOMY     APPENDECTOMY     OVARIAN CYST SURGERY      Home Medications:  Current Meds  Medication Sig   Biotin  09811 MCG TBDP Take 10,000 mcg by mouth daily.   Calcium Carb-Cholecalciferol (CALCIUM 1000 + D) 1000-20 MG-MCG TABS Take 1 tablet by mouth at bedtime.   Cholecalciferol 75 MCG (3000 UT) TABS Take 75 mcg by mouth daily.   FERROUS SULFATE PO Take 65 mg by mouth daily.   ibuprofen (ADVIL) 600 MG tablet Take 1 tablet (600 mg total) by mouth every 6 (six) hours as needed.   levothyroxine (SYNTHROID) 125 MCG tablet Take 125 mcg by mouth daily before breakfast.   lidocaine (XYLOCAINE) 5 % ointment Apply 1 Application topically daily. To right thigh.   montelukast (SINGULAIR) 10 MG tablet Take 10 mg by mouth at bedtime.   Multiple Vitamins-Minerals (BARIATRIC MULTIVITAMINS/IRON PO) Take 1 tablet by mouth daily.   oxyCODONE-acetaminophen (PERCOCET) 5-325 MG tablet Take 1 tablet by mouth every 4 (four) hours as needed for up to 5 days for severe pain.   phentermine (ADIPEX-P) 37.5 MG tablet Take 1 tablet by mouth daily before breakfast.   pregabalin (LYRICA) 25 MG capsule Take 25 mg by mouth as directed. Take 25mg  in the morning and 75mg  at night.   senna-docusate (SENOKOT-S) 8.6-50 MG tablet Take 1 tablet by mouth daily as needed.   topiramate (TOPAMAX) 25 MG tablet Take 25 mg by mouth daily.   XELJANZ XR 11 MG TB24 Take 11 mg by mouth daily.   Zinc Sulfate (ZINC 15 PO) Take 1 tablet by mouth daily.    Allergies:  Allergies  Allergen Reactions   Bupivacaine  Liposome Shortness Of Breath   Latex Hives, Itching, Rash and Swelling   Lisinopril Swelling and Other (See Comments)   Nsaids Other (See Comments)    Cannot have after gastric bypass   Semaglutide Nausea And Vomiting   Promethazine Other (See Comments)    Muscle spasms   Sulfa Antibiotics Rash and Other (See Comments)    History reviewed. No pertinent family history.  Social History:  reports that she has never smoked. She has never used smokeless tobacco. She reports that she does not drink alcohol and does not use drugs.  ROS: A  complete review of systems was performed.  All systems are negative except for pertinent findings as noted.  Physical Exam:  Vital signs in last 24 hours: Temp:  [98.3 F (36.8 C)] 98.3 F (36.8 C) (07/08 1610) Pulse Rate:  [56-90] 57 (07/08 0632) Resp:  [16-24] 18 (07/08 0021) BP: (104-148)/(68-115) 110/74 (07/08 9604) SpO2:  [96 %-100 %] 99 % (07/08 5409) Weight:  [87.5 kg-89.6 kg] 89.6 kg (07/08 8119) Constitutional:  Alert and oriented, no acute distress HEENT: [place] AT, moist mucus membranes Cardiovascular: No clubbing, cyanosis, or edema Respiratory: Normal respiratory effort Skin: No rashes, bruises or suspicious lesions Neurologic: Grossly intact, no focal deficits, moving all 4 extremities Psychiatric: Normal mood and affect   Laboratory Data:  Recent Labs    07/06/23 1719 07/07/23 0631  WBC 7.3 5.2  HGB 15.4* 12.4  HCT 46.3* 38.3   Recent Labs    07/06/23 1719 07/07/23 0631  NA 140  --   K 3.8  --   CL 110  --   CO2 20*  --   GLUCOSE 98  --   BUN 15  --   CREATININE 0.86 0.83  CALCIUM 9.5  --    Urinalysis    Component Value Date/Time   COLORURINE YELLOW (A) 07/06/2023 2128   APPEARANCEUR CLOUDY (A) 07/06/2023 2128   APPEARANCEUR Hazy 12/26/2014 0249   LABSPEC 1.018 07/06/2023 2128   LABSPEC 1.026 12/26/2014 0249   PHURINE 5.0 07/06/2023 2128   GLUCOSEU NEGATIVE 07/06/2023 2128   GLUCOSEU Negative 12/26/2014 0249   HGBUR LARGE (A) 07/06/2023 2128   BILIRUBINUR NEGATIVE 07/06/2023 2128   BILIRUBINUR Negative 12/26/2014 0249   KETONESUR 5 (A) 07/06/2023 2128   PROTEINUR NEGATIVE 07/06/2023 2128   UROBILINOGEN 0.2 08/05/2012 0102   NITRITE NEGATIVE 07/06/2023 2128   LEUKOCYTESUR NEGATIVE 07/06/2023 2128   LEUKOCYTESUR Negative 12/26/2014 0249   Results for orders placed or performed during the hospital encounter of 11/02/19  Culture, blood (routine x 2)     Status: None   Collection Time: 11/02/19  5:53 PM   Specimen: BLOOD  Result Value Ref  Range Status   Specimen Description BLOOD RIGHT ANTECUBITAL  Final   Special Requests   Final    BOTTLES DRAWN AEROBIC AND ANAEROBIC Blood Culture adequate volume   Culture   Final    NO GROWTH 5 DAYS Performed at [facility], [address], [place], [place] [address]    Report Status 11/07/2019 FINAL  Final  Culture, blood (routine x 2)     Status: None   Collection Time: 11/02/19  5:53 PM   Specimen: BLOOD  Result Value Ref Range Status   Specimen Description BLOOD LEFT ANTECUBITAL  Final   Special Requests   Final    BOTTLES DRAWN AEROBIC AND ANAEROBIC Blood Culture adequate volume   Culture   Final    NO GROWTH 5 DAYS Performed  at [facility], [address] [place], [place] [address]    Report Status 11/07/2019 FINAL  Final  Group A Strep by PCR ([facility] Only)     Status: None   Collection Time: 11/02/19  5:53 PM   Specimen: Throat; Sterile Swab  Result Value Ref Range Status   Group A Strep by PCR NOT DETECTED NOT DETECTED Final    Comment: Performed at [facility], [address] [place], [place] [address]  SARS Coronavirus 2 by RT PCR (hospital order, performed in [facility] hospital lab) Nasopharyngeal Throat     Status: None   Collection Time: 11/02/19  5:53 PM   Specimen: Throat; Nasopharyngeal  Result Value Ref Range Status   SARS Coronavirus 2 NEGATIVE NEGATIVE Final    Comment: (NOTE) If result is NEGATIVE SARS-CoV-2 target nucleic acids are NOT DETECTED. The SARS-CoV-2 RNA is generally detectable in upper and lower  respiratory specimens during the acute phase of infection. The lowest  concentration of SARS-CoV-2 viral copies this assay can detect is 250  copies / mL. A negative result does not preclude SARS-CoV-2 infection  and should not be used as the sole basis for treatment or other  patient management decisions.  A negative result may occur with  improper specimen collection / handling, submission of specimen other  than  nasopharyngeal swab, presence of viral mutation(s) within the  areas targeted by this assay, and inadequate number of viral copies  (<250 copies / mL). A negative result must be combined with clinical  observations, patient history, and epidemiological information. If result is POSITIVE SARS-CoV-2 target nucleic acids are DETECTED. The SARS-CoV-2 RNA is generally detectable in upper and lower  respiratory specimens dur ing the acute phase of infection.  Positive  results are indicative of active infection with SARS-CoV-2.  Clinical  correlation with patient history and other diagnostic information is  necessary to determine patient infection status.  Positive results do  not rule out bacterial infection or co-infection with other viruses. If result is PRESUMPTIVE POSTIVE SARS-CoV-2 nucleic acids MAY BE PRESENT.   A presumptive positive result was obtained on the submitted specimen  and confirmed on repeat testing.  While 2019 novel coronavirus  (SARS-CoV-2) nucleic acids may be present in the submitted sample  additional confirmatory testing may be necessary for epidemiological  and / or clinical management purposes  to differentiate between  SARS-CoV-2 and other Sarbecovirus currently known to infect humans.  If clinically indicated additional testing with an alternate test  methodology [phone]) is advised. The SARS-CoV-2 RNA is generally  detectable in upper and lower respiratory sp ecimens during the acute  phase of infection. The expected result is Negative. Fact Sheet for Patients:  BoilerBrush.com.cy Fact Sheet for Healthcare Providers: https://pope.com/ This test is not yet approved or cleared by the [place] FDA and has been authorized for detection and/or diagnosis of SARS-CoV-2 by FDA under an Emergency Use Authorization (EUA).  This EUA will remain in effect (meaning this test can be used) for the duration of the COVID-19  declaration under Section 564(b)(1) of the Act, 21 U.S.C. section 360bbb-3(b)(1), unless the authorization is terminated or revoked sooner. Performed at [facility], [address] [place], [place] [address]     Radiologic Imaging: CT Renal Stone Study  Result Date: 07/06/2023 CLINICAL DATA:  Right flank pain EXAM: CT ABDOMEN AND PELVIS WITHOUT CONTRAST TECHNIQUE: Multidetector CT imaging of the abdomen and pelvis was performed following the standard protocol without IV  contrast. RADIATION DOSE REDUCTION: This exam was performed according to the departmental dose-optimization program which includes automated exposure control, adjustment of the mA and/or kV according to patient size and/or use of iterative reconstruction technique. COMPARISON:  CT abdomen and pelvis dated September 17, 2021 FINDINGS: Lower chest: No acute abnormality. Hepatobiliary: No focal liver abnormality is seen. No gallstones, gallbladder wall thickening, or biliary dilatation. Pancreas: Unremarkable. No pancreatic ductal dilatation or surrounding inflammatory changes. Spleen: Normal in size without focal abnormality. Adrenals/Urinary Tract: Bilateral adrenal glands are unremarkable. Severe right hydronephrosis and mild hydroureter. Normal appearance of the left kidney. Bladder is decompressed. Stomach/Bowel: Prior Roux-en-Y gastrectomy. No evidence of bowel wall thickening, distention, or inflammatory changes. Vascular/Lymphatic: Aortic atherosclerosis. No enlarged abdominal or pelvic lymph nodes. Reproductive: Bilateral adnexa are unremarkable. Other: No abdominal wall hernia or abnormality. No abdominopelvic ascites. Musculoskeletal: No acute or significant osseous findings. IMPRESSION: 1. Severe right hydronephrosis and mild hydroureter. No obstructing stone visualized. Findings are likely due to recently passed stone. 2. Aortic Atherosclerosis (ICD10-I70.0). Electronically Signed   By: Allegra Lai M.D.   On:  07/06/2023 17:59    Assessment & Plan:  47 year old female with PMH gastric bypass and nephrolithiasis admitted for pain control with CT findings of right hydroureteronephrosis without obstructing stone.  CT and UA are most consistent with recently passed right ureteral stone; ureteral stricture or urologic malignancy are far less likely.  No indication for urgent intervention at this time with low suspicion for UTI, stable vitals, and stable renal function.  We recommend making her n.p.o. at midnight tonight with plans for follow-up renal ultrasound tomorrow morning.  If hydronephrosis is stable or worsening, can add her on for diagnostic ureteroscopy with possible biopsy, possible right ureteral stent placement.  If hydronephrosis is improved to resolved, no further intervention indicated.  With regard to her recent incidental findings on prior CT at [facility], agree with nephrology that infarct is far less likely given stable renal function.  Recommendations: -Advance diet for today, continue pain control -N.p.o. at midnight -Renal ultrasound tomorrow morning -Consider add on diagnostic right ureteroscopy with possible biopsy and possible right ureteral stent placement with Dr. Lonna Cobb tomorrow if hydroureteronephrosis is stable on ultrasound  Thank you for involving me in this patient's care, I will continue to follow along.  Carman Ching, PA-C 07/07/2023 9:12 AM

## 2023-07-07 NOTE — Progress Notes (Signed)
Same day rounding progress note  Patient seen and examined.  Please see Dr. Lianne Bushy dictated history and physical for further details.  I agree with assessment and plan.  Right flank pain Severe right hydronephrosis on CT without obstructive stone stone Seen by urology, plan for renal ultrasound tomorrow morning.  If hydronephrosis is stable or worsening she may need ureteroscopy with possible biopsy and or right ureteral stent placement.   If hydronephrosis improved/resolved, no further intervention indicated Order diet for today.  N.p.o. after midnight  Time spent 20 minutes

## 2023-07-08 ENCOUNTER — Inpatient Hospital Stay: Payer: 59

## 2023-07-08 ENCOUNTER — Other Ambulatory Visit: Payer: Self-pay | Admitting: Physician Assistant

## 2023-07-08 DIAGNOSIS — N1330 Unspecified hydronephrosis: Secondary | ICD-10-CM | POA: Diagnosis not present

## 2023-07-08 DIAGNOSIS — E039 Hypothyroidism, unspecified: Secondary | ICD-10-CM | POA: Diagnosis not present

## 2023-07-08 DIAGNOSIS — R109 Unspecified abdominal pain: Secondary | ICD-10-CM | POA: Diagnosis not present

## 2023-07-08 LAB — CBC
HCT: 38.7 % (ref 36.0–46.0)
Hemoglobin: 12.4 g/dL (ref 12.0–15.0)
MCH: 27.8 pg (ref 26.0–34.0)
MCHC: 32 g/dL (ref 30.0–36.0)
MCV: 86.8 fL (ref 80.0–100.0)
Platelets: 251 10*3/uL (ref 150–400)
RBC: 4.46 MIL/uL (ref 3.87–5.11)
RDW: 13.1 % (ref 11.5–15.5)
WBC: 6.3 10*3/uL (ref 4.0–10.5)
nRBC: 0 % (ref 0.0–0.2)

## 2023-07-08 LAB — COMPREHENSIVE METABOLIC PANEL
ALT: 23 U/L (ref 0–44)
AST: 16 U/L (ref 15–41)
Albumin: 3.4 g/dL — ABNORMAL LOW (ref 3.5–5.0)
Alkaline Phosphatase: 71 U/L (ref 38–126)
Anion gap: 7 (ref 5–15)
BUN: 13 mg/dL (ref 6–20)
CO2: 25 mmol/L (ref 22–32)
Calcium: 8.5 mg/dL — ABNORMAL LOW (ref 8.9–10.3)
Chloride: 106 mmol/L (ref 98–111)
Creatinine, Ser: 0.79 mg/dL (ref 0.44–1.00)
GFR, Estimated: >60 mL/min (ref >60)
Glucose, Bld: 96 mg/dL (ref 70–99)
Potassium: 3.7 mmol/L (ref 3.5–5.1)
Sodium: 138 mmol/L (ref 135–145)
Total Bilirubin: 0.7 mg/dL (ref 0.3–1.2)
Total Protein: 5.9 g/dL — ABNORMAL LOW (ref 6.5–8.1)

## 2023-07-08 NOTE — Addendum Note (Signed)
Addended by: Debarah Crape on: 07/08/2023 10:11 AM   Modules accepted: Orders

## 2023-07-08 NOTE — Progress Notes (Signed)
Urology Inpatient Progress Note  Subjective: No acute events overnight.  She is afebrile, VSS. Creatinine down today, 0.79. Renal ultrasound today shows interval improvement of right hydroureteronephrosis, now mild in severity.  There is new mild left hydronephrosis. She reports improvement in her right flank pain today, now 6-7/10 in severity.  Current Facility-Administered Medications  Medication Dose Route Frequency Provider Last Rate Last Admin   acetaminophen (TYLENOL) tablet 650 mg  650 mg Oral Q6H PRN Andris Baumann, MD       Or   acetaminophen (TYLENOL) suppository 650 mg  650 mg Rectal Q6H PRN Andris Baumann, MD       enoxaparin (LOVENOX) injection 45 mg  0.5 mg/kg Subcutaneous Q24H Lindajo Royal V, MD   45 mg at 07/08/23 0726   HYDROcodone-acetaminophen (NORCO/VICODIN) 5-325 MG per tablet 1-2 tablet  1-2 tablet Oral Q4H PRN Andris Baumann, MD   2 tablet at 07/08/23 0726   HYDROmorphone (DILAUDID) injection 1 mg  1 mg Intravenous Q3H PRN Delfino Lovett, MD   1 mg at 07/07/23 1959   levothyroxine (SYNTHROID) tablet 125 mcg  125 mcg Oral Q0600 Andris Baumann, MD   125 mcg at 07/08/23 0537   ondansetron (ZOFRAN) tablet 4 mg  4 mg Oral Q6H PRN Andris Baumann, MD   4 mg at 07/08/23 0216   Or   ondansetron (ZOFRAN) injection 4 mg  4 mg Intravenous Q6H PRN Andris Baumann, MD   4 mg at 07/07/23 1758   Oral care mouth rinse  15 mL Mouth Rinse PRN Delfino Lovett, MD       Objective: Vital signs in last 24 hours: Temp:  [97.5 F (36.4 C)-98.7 F (37.1 C)] 98.3 F (36.8 C) (07/09 0738) Pulse Rate:  [53-65] 53 (07/09 0738) Resp:  [16-18] 18 (07/09 0738) BP: (106-119)/(62-73) 119/62 (07/09 0738) SpO2:  [98 %-100 %] 99 % (07/09 0738)  Intake/Output from previous day: No intake/output data recorded. Intake/Output this shift: No intake/output data recorded.  Physical Exam Vitals and nursing note reviewed.  Constitutional:      General: She is not in acute distress.    Appearance:  She is not ill-appearing, toxic-appearing or diaphoretic.  HENT:     Head: Normocephalic and atraumatic.  Pulmonary:     Effort: Pulmonary effort is normal. No respiratory distress.  Skin:    General: Skin is warm and dry.  Neurological:     Mental Status: She is alert and oriented to person, place, and time.  Psychiatric:        Mood and Affect: Mood normal.        Behavior: Behavior normal.    Lab Results:  Recent Labs    07/07/23 0631 07/08/23 0451  WBC 5.2 6.3  HGB 12.4 12.4  HCT 38.3 38.7  PLT 255 251   BMET Recent Labs    07/06/23 1719 07/07/23 0631 07/08/23 0451  NA 140  --  138  K 3.8  --  3.7  CL 110  --  106  CO2 20*  --  25  GLUCOSE 98  --  96  BUN 15  --  13  CREATININE 0.86 0.83 0.79  CALCIUM 9.5  --  8.5*   Studies/Results: US RENAL  Result Date: 07/08/2023 CLINICAL DATA:  Hydronephrosis EXAM: RENAL / URINARY TRACT ULTRASOUND COMPLETE COMPARISON:  CT renal stone protocol July 7th 2024 FINDINGS: Right Kidney: Renal measurements: 11.7 x 4.1 x 6.3 cm = volume: 160 mL. Echogenicity within  normal limits. Mild hydronephrosis. No shadowing stones or mass visualized. Left Kidney: Renal measurements: 11.4 x 5.6 x 5.9 cm = volume: 196 mL. Echogenicity within normal limits. Mild hydronephrosis. No shadowing stones or mass visualized. Bladder: Appears normal for degree of bladder distention. Other: None. IMPRESSION: Bilateral mild hydronephrosis.  No nephrolithiasis identified. Electronically Signed   By: Jacob Moores M.D.   On: 07/08/2023 08:36   CT Renal Stone Study  Result Date: 07/06/2023 CLINICAL DATA:  Right flank pain EXAM: CT ABDOMEN AND PELVIS WITHOUT CONTRAST TECHNIQUE: Multidetector CT imaging of the abdomen and pelvis was performed following the standard protocol without IV contrast. RADIATION DOSE REDUCTION: This exam was performed according to the departmental dose-optimization program which includes automated exposure control, adjustment of the mA  and/or kV according to patient size and/or use of iterative reconstruction technique. COMPARISON:  CT abdomen and pelvis dated September 17, 2021 FINDINGS: Lower chest: No acute abnormality. Hepatobiliary: No focal liver abnormality is seen. No gallstones, gallbladder wall thickening, or biliary dilatation. Pancreas: Unremarkable. No pancreatic ductal dilatation or surrounding inflammatory changes. Spleen: Normal in size without focal abnormality. Adrenals/Urinary Tract: Bilateral adrenal glands are unremarkable. Severe right hydronephrosis and mild hydroureter. Normal appearance of the left kidney. Bladder is decompressed. Stomach/Bowel: Prior Roux-en-Y gastrectomy. No evidence of bowel wall thickening, distention, or inflammatory changes. Vascular/Lymphatic: Aortic atherosclerosis. No enlarged abdominal or pelvic lymph nodes. Reproductive: Bilateral adnexa are unremarkable. Other: No abdominal wall hernia or abnormality. No abdominopelvic ascites. Musculoskeletal: No acute or significant osseous findings. IMPRESSION: 1. Severe right hydronephrosis and mild hydroureter. No obstructing stone visualized. Findings are likely due to recently passed stone. 2. Aortic Atherosclerosis (ICD10-I70.0). Electronically Signed   By: Allegra Lai M.D.   On: 07/06/2023 17:59    Assessment & Plan: 47 year old female with PMH gastric bypass and nephrolithiasis admitted for pain control with CT findings of right hydroureteronephrosis without obstructing stone.  Renal function at baseline.  Renal ultrasound this morning shows interval improvement of right hydroureteronephrosis.  She does have some new mild hydronephrosis of unclear etiology.  Given symptomatic improvement, clinical improvement on the right side, and stable renal function, I do not recommend proceeding to the OR today for right ureteroscopy today.  Instead recommend supportive care to allow hydronephrosis to resolve on its own.  We discussed that I do  recommend outpatient follow-up in our clinic.  I will have her obtain a CT urogram prior to being seen by Dr. Lonna Cobb.  Recommendations: -Okay to advance diet, no plans for procedure today -Supportive care -Outpatient urology follow-up with CT urogram prior  [facility], PA-C 07/08/2023

## 2023-07-08 NOTE — TOC CM/SW Note (Signed)
Transition of Care [facility]) - Inpatient Brief Assessment   Patient Details  Name: Nichole Fuller MRN: [id number] Date of Birth: February 04, 1976  Transition of Care [facility]) CM/SW Contact:    Chapman Fitch, RN Phone Number: 07/08/2023, 11:57 AM   Clinical Narrative:    Transition of Care Asessment: Insurance and Status: Insurance coverage has been reviewed Patient has primary care physician: Yes     Prior/Current Home Services: No current home services Social Determinants of Health Reivew: SDOH reviewed no interventions necessary Readmission risk has been reviewed: Yes Transition of care needs: no transition of care needs at this time

## 2023-07-09 NOTE — Discharge Summary (Signed)
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   * Right flank pain Severe right hydronephrosis on CT without obstructive stone stone Patient with chronic flank pain x 2 months Renal function at baseline.  Renal ultrasound this morning shows interval improvement of right hydroureteronephrosis.  She does have some new mild hydronephrosis of unclear etiology. - Given symptomatic improvement, clinical improvement on the right side, and stable renal function, Urology did not recommend proceeding to the OR while in the hospital for right ureteroscopy today.  Instead recommend supportive care to allow hydronephrosis to resolve on its own. - Urology recommends outpatient follow-up - will need a CT urogram prior to being seen by Dr. Lonna Cobb. - patient tolerated diet and has reasonable pain control. She is agreeable with the D/C plans.  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 Acquired hypothyroidism         Consultants: Urology Disposition: Home Diet recommendation:  Discharge Diet Orders (From admission, onward)     Start     Ordered   07/08/23 0000  Diet - low sodium heart healthy        07/08/23 1039           Carb modified diet DISCHARGE MEDICATION: Allergies as of 07/08/2023       Reactions   Bupivacaine Liposome Shortness Of Breath   Latex Hives, Itching,  Rash, Swelling   Lisinopril Swelling, Other (See Comments)   Nsaids Other (See Comments)   Cannot have after gastric bypass   Semaglutide Nausea And Vomiting   Promethazine Other (See Comments)   Muscle spasms   Sulfa Antibiotics Rash, Other (See Comments)        Medication List     TAKE these medications    BARIATRIC MULTIVITAMINS/IRON PO Take 1 tablet by mouth daily.   Biotin 86578 MCG Tbdp Take 10,000 mcg by mouth daily.   Calcium 1000 + D 1000-20 MG-MCG Tabs Generic drug: Calcium Carb-Cholecalciferol Take 1 tablet by mouth at bedtime.   Cholecalciferol 75 MCG (3000 UT) Tabs Take 75 mcg by mouth daily.   FERROUS SULFATE PO Take 65 mg by mouth daily.   ibuprofen 600 MG tablet Commonly known as: ADVIL Take 1 tablet (600 mg total) by mouth every 6 (six) hours as needed.   levothyroxine 125 MCG tablet Commonly known as: SYNTHROID Take 125 mcg by mouth daily before breakfast.   lidocaine 5 % ointment Commonly known as: XYLOCAINE Apply 1 Application topically daily. To right thigh.   montelukast 10 MG tablet Commonly known as: SINGULAIR Take 10 mg by mouth at bedtime.   oxyCODONE-acetaminophen 5-325 MG tablet Commonly known as: Percocet Take 1 tablet by mouth every 4 (four) hours as needed for up to 5 days for severe pain.   pantoprazole 40 MG tablet Commonly known as: Protonix Take 1 tablet (40 mg total) by mouth daily.   phentermine 37.5 MG tablet Commonly known as: ADIPEX-P Take  1 tablet by mouth daily before breakfast.   pregabalin 25 MG capsule Commonly known as: LYRICA Take 25 mg by mouth as directed. Take 25mg  in the morning and 75mg  at night.   senna-docusate 8.6-50 MG tablet Commonly known as: Senokot-S Take 1 tablet by mouth daily as needed.   sucralfate 1 g tablet Commonly known as: Carafate Take 1 tablet (1 g total) by mouth 4 (four) times daily.   topiramate 25 MG tablet Commonly known as: TOPAMAX Take 25 mg by mouth daily.    Xeljanz XR 11 MG Tb24 Generic drug: Tofacitinib Citrate ER Take 11 mg by mouth daily.   ZINC 15 PO Take 1 tablet by mouth daily.        Follow-up Information     Riki Altes, MD. Go on 07/10/2023.   Specialty: Urology Why: You have a appointment 07/10/2023 at 3:30pm. Contact information: [address] [place] [address] [phone]         [facility] Emergency Department at [facility]. Go to .   Specialty: Emergency Medicine Why: If symptoms worsen Contact information: [address] [id number] [address] [place] [address] [phone]               Discharge Exam: Ceasar Mons Weights   07/06/23 1716 07/07/23 4132  Weight: 87.5 kg 89.6 kg   Constitutional:      General: She is not in acute distress. HENT:     Head: Normocephalic and atraumatic.  Cardiovascular:     Rate and Rhythm: Normal rate and regular rhythm.     Heart sounds: Normal heart sounds.  Pulmonary:     Effort: Pulmonary effort is normal.     Breath sounds: Normal breath sounds.  Abdominal:     Palpations: Abdomen is soft.     Tenderness: There is no abdominal tenderness.  Neurological:     Mental Status: Mental status is at baseline.   Condition at discharge: fair  The results of significant diagnostics from this hospitalization (including imaging, microbiology, ancillary and laboratory) are listed below for reference.   Imaging Studies: US RENAL  Result Date: 07/08/2023 CLINICAL DATA:  Hydronephrosis EXAM: RENAL / URINARY TRACT ULTRASOUND COMPLETE COMPARISON:  CT renal stone protocol July 7th 2024 FINDINGS: Right Kidney: Renal measurements: 11.7 x 4.1 x 6.3 cm = volume: 160 mL. Echogenicity within normal limits. Mild hydronephrosis. No shadowing stones or mass visualized. Left Kidney: Renal measurements: 11.4 x 5.6 x 5.9 cm = volume: 196 mL. Echogenicity within normal limits. Mild hydronephrosis. No shadowing stones or mass visualized. Bladder:  Appears normal for degree of bladder distention. Other: None. IMPRESSION: Bilateral mild hydronephrosis.  No nephrolithiasis identified. Electronically Signed   By: Jacob Moores M.D.   On: 07/08/2023 08:36   CT Renal Stone Study  Result Date: 07/06/2023 CLINICAL DATA:  Right flank pain EXAM: CT ABDOMEN AND PELVIS WITHOUT CONTRAST TECHNIQUE: Multidetector CT imaging of the abdomen and pelvis was performed following the standard protocol without IV contrast. RADIATION DOSE REDUCTION: This exam was performed according to the departmental dose-optimization program which includes automated exposure control, adjustment of the mA and/or kV according to patient size and/or use of iterative reconstruction technique. COMPARISON:  CT abdomen and pelvis dated September 17, 2021 FINDINGS: Lower chest: No acute abnormality. Hepatobiliary: No focal liver abnormality is seen. No gallstones, gallbladder wall thickening, or biliary dilatation. Pancreas: Unremarkable. No pancreatic ductal dilatation or surrounding inflammatory changes. Spleen: Normal in size without focal abnormality. Adrenals/Urinary Tract: Bilateral  adrenal glands are unremarkable. Severe right hydronephrosis and mild hydroureter. Normal appearance of the left kidney. Bladder is decompressed. Stomach/Bowel: Prior Roux-en-Y gastrectomy. No evidence of bowel wall thickening, distention, or inflammatory changes. Vascular/Lymphatic: Aortic atherosclerosis. No enlarged abdominal or pelvic lymph nodes. Reproductive: Bilateral adnexa are unremarkable. Other: No abdominal wall hernia or abnormality. No abdominopelvic ascites. Musculoskeletal: No acute or significant osseous findings. IMPRESSION: 1. Severe right hydronephrosis and mild hydroureter. No obstructing stone visualized. Findings are likely due to recently passed stone. 2. Aortic Atherosclerosis (ICD10-I70.0). Electronically Signed   By: Allegra Lai M.D.   On: 07/06/2023 17:59     Microbiology: Results for orders placed or performed during the hospital encounter of 11/02/19  Culture, blood (routine x 2)     Status: None   Collection Time: 11/02/19  5:53 PM   Specimen: BLOOD  Result Value Ref Range Status   Specimen Description BLOOD RIGHT ANTECUBITAL  Final   Special Requests   Final    BOTTLES DRAWN AEROBIC AND ANAEROBIC Blood Culture adequate volume   Culture   Final    NO GROWTH 5 DAYS Performed at [facility], [address], [place], [place] [address]    Report Status 11/07/2019 FINAL  Final  Culture, blood (routine x 2)     Status: None   Collection Time: 11/02/19  5:53 PM   Specimen: BLOOD  Result Value Ref Range Status   Specimen Description BLOOD LEFT ANTECUBITAL  Final   Special Requests   Final    BOTTLES DRAWN AEROBIC AND ANAEROBIC Blood Culture adequate volume   Culture   Final    NO GROWTH 5 DAYS Performed at [facility], [address] [place], [place] [address]    Report Status 11/07/2019 FINAL  Final  Group A Strep by PCR ([facility] Only)     Status: None   Collection Time: 11/02/19  5:53 PM   Specimen: Throat; Sterile Swab  Result Value Ref Range Status   Group A Strep by PCR NOT DETECTED NOT DETECTED Final    Comment: Performed at [facility], [address] [place], [place] [address]  SARS Coronavirus 2 by RT PCR (hospital order, performed in [facility] hospital lab) Nasopharyngeal Throat     Status: None   Collection Time: 11/02/19  5:53 PM   Specimen: Throat; Nasopharyngeal  Result Value Ref Range Status   SARS Coronavirus 2 NEGATIVE NEGATIVE Final    Comment: (NOTE) If result is NEGATIVE SARS-CoV-2 target nucleic acids are NOT DETECTED. The SARS-CoV-2 RNA is generally detectable in upper and lower  respiratory specimens during the acute phase of infection. The lowest  concentration of SARS-CoV-2 viral copies this assay can detect is 250  copies / mL. A negative result does not preclude  SARS-CoV-2 infection  and should not be used as the sole basis for treatment or other  patient management decisions.  A negative result may occur with  improper specimen collection / handling, submission of specimen other  than nasopharyngeal swab, presence of viral mutation(s) within the  areas targeted by this assay, and inadequate number of viral copies  (<250 copies / mL). A negative result must be combined with clinical  observations, patient history, and epidemiological information. If result is POSITIVE SARS-CoV-2 target nucleic acids are DETECTED. The SARS-CoV-2 RNA is generally detectable in upper and lower  respiratory specimens dur ing the acute phase of infection.  Positive  results are indicative of active infection with SARS-CoV-2.  Clinical  correlation  with patient history and other diagnostic information is  necessary to determine patient infection status.  Positive results do  not rule out bacterial infection or co-infection with other viruses. If result is PRESUMPTIVE POSTIVE SARS-CoV-2 nucleic acids MAY BE PRESENT.   A presumptive positive result was obtained on the submitted specimen  and confirmed on repeat testing.  While 2019 novel coronavirus  (SARS-CoV-2) nucleic acids may be present in the submitted sample  additional confirmatory testing may be necessary for epidemiological  and / or clinical management purposes  to differentiate between  SARS-CoV-2 and other Sarbecovirus currently known to infect humans.  If clinically indicated additional testing with an alternate test  methodology [phone]) is advised. The SARS-CoV-2 RNA is generally  detectable in upper and lower respiratory sp ecimens during the acute  phase of infection. The expected result is Negative. Fact Sheet for Patients:  BoilerBrush.com.cy Fact Sheet for Healthcare Providers: https://pope.com/ This test is not yet approved or cleared by the [place] FDA and has been authorized for detection and/or diagnosis of SARS-CoV-2 by FDA under an Emergency Use Authorization (EUA).  This EUA will remain in effect (meaning this test can be used) for the duration of the COVID-19 declaration under Section 564(b)(1) of the Act, 21 U.S.C. section 360bbb-3(b)(1), unless the authorization is terminated or revoked sooner. Performed at [facility], [address] [place], [place] [address]     Labs: CBC: Recent Labs  Lab 07/06/23 1719 07/07/23 0631 07/08/23 0451  WBC 7.3 5.2 6.3  HGB 15.4* 12.4 12.4  HCT 46.3* 38.3 38.7  MCV 84.5 86.3 86.8  PLT 372 255 251   Basic Metabolic Panel: Recent Labs  Lab 07/06/23 1719 07/07/23 0631 07/08/23 0451  NA 140  --  138  K 3.8  --  3.7  CL 110  --  106  CO2 20*  --  25  GLUCOSE 98  --  96  BUN 15  --  13  CREATININE 0.86 0.83 0.79  CALCIUM 9.5  --  8.5*   Liver Function Tests: Recent Labs  Lab 07/08/23 0451  AST 16  ALT 23  ALKPHOS 71  BILITOT 0.7  PROT 5.9*  ALBUMIN 3.4*   CBG: Recent Labs  Lab 07/07/23 0629  GLUCAP 93    Discharge time spent: greater than 30 minutes.  Signed: Delfino Lovett, MD [facility] 07/09/2023

## 2023-07-10 ENCOUNTER — Ambulatory Visit: Payer: 59 | Admitting: Urology

## 2023-07-10 ENCOUNTER — Encounter: Payer: Self-pay | Admitting: Urology

## 2023-07-10 VITALS — BP 112/76 | HR 106 | Ht 61.0 in | Wt 190.0 lb

## 2023-07-10 DIAGNOSIS — N1330 Unspecified hydronephrosis: Secondary | ICD-10-CM | POA: Diagnosis not present

## 2023-07-10 DIAGNOSIS — R109 Unspecified abdominal pain: Secondary | ICD-10-CM

## 2023-07-10 DIAGNOSIS — R102 Pelvic and perineal pain: Secondary | ICD-10-CM | POA: Diagnosis not present

## 2023-07-10 MED ORDER — GEMTESA 75 MG PO TABS: 75.0000 mg | ORAL_TABLET | Freq: Every day | ORAL | 0 refills | Status: DC

## 2023-07-10 NOTE — Progress Notes (Signed)
I, Maysun L Gibbs,acting as a scribe for Riki Altes, MD.,have documented all relevant documentation on the behalf of Riki Altes, MD,as directed by  Riki Altes, MD while in the presence of Riki Altes, MD.  07/10/2023 3:44 PM   Vikki Ports Nathanial Rancher [birth year] [phone]  Referring provider: [address] [address] [place],  [place] [address]  Chief Complaint  Patient presents with   Results    HPI: Nichole Fuller is a 47 y.o. female presents for follow-up of recent hospitalization.   She presented to the ED 07/06/23 complaining of right flank pain with a previous history of stone disease. Pain was severe and she was hospitalized for pain control.  CT showed right hydronephrosis/hydroureter, which was moderate to the distal ureter, but no stone was seen.  A follow-up renal ultrasound the following day showed mild hydronephrosis. She was discharged on 07/08/23.  Refer to [facility] inpatient consultation note of 07/07/2023 for details. Since discharge, she states her right flank pain has improved, though she is now having left flank pain and bilateral pelvic discomfort associated with spasms and urgency at times.  Her last stone was over 15 years ago and she did not require any intervention.  She states her pain that occurred was not similar to her previous stone pain. CT urogram was recommended and has not yet been scheduled.   PMH: Past Medical History:  Diagnosis Date   Hypothyroid    Kidney infection 2024   Kidney stone    years ago   Rheumatoid arthritis(714.0)     Surgical History: Past Surgical History:  Procedure Laterality Date   ABDOMINAL HYSTERECTOMY     APPENDECTOMY     OVARIAN CYST SURGERY      Home Medications:  Allergies as of 07/10/2023       Reactions   Bupivacaine Liposome Shortness Of Breath   Latex Hives, Itching, Rash, Swelling   Lisinopril Swelling, Other (See Comments)   Nsaids Other (See  Comments)   Cannot have after gastric bypass   Semaglutide Nausea And Vomiting   Promethazine Other (See Comments)   Muscle spasms   Sulfa Antibiotics Rash, Other (See Comments)        Medication List        Accurate as of July 10, 2023  3:44 PM. If you have any questions, ask your nurse or doctor.          STOP taking these medications    pantoprazole 40 MG tablet Commonly known as: Protonix Stopped by: Riki Altes   sucralfate 1 g tablet Commonly known as: Carafate Stopped by: Riki Altes       TAKE these medications    BARIATRIC MULTIVITAMINS/IRON PO Take 1 tablet by mouth daily.   Biotin 32440 MCG Tbdp Take 10,000 mcg by mouth daily.   Calcium 1000 + D 1000-20 MG-MCG Tabs Generic drug: Calcium Carb-Cholecalciferol Take 1 tablet by mouth at bedtime.   Cholecalciferol 75 MCG (3000 UT) Tabs Take 75 mcg by mouth daily.   FERROUS SULFATE PO Take 65 mg by mouth daily.   Gemtesa 75 MG Tabs Generic drug: Vibegron Take 1 tablet (75 mg total) by mouth daily. Started by: Riki Altes   ibuprofen 600 MG tablet Commonly known as: ADVIL Take 1 tablet (600 mg total) by mouth every 6 (six) hours as needed.   levothyroxine 125 MCG tablet Commonly known as: SYNTHROID Take 125 mcg by mouth daily before  breakfast.   lidocaine 5 % ointment Commonly known as: XYLOCAINE Apply 1 Application topically daily. To right thigh.   montelukast 10 MG tablet Commonly known as: SINGULAIR Take 10 mg by mouth at bedtime.   oxyCODONE-acetaminophen 5-325 MG tablet Commonly known as: Percocet Take 1 tablet by mouth every 4 (four) hours as needed for up to 5 days for severe pain.   phentermine 37.5 MG tablet Commonly known as: ADIPEX-P Take 1 tablet by mouth daily before breakfast.   pregabalin 25 MG capsule Commonly known as: LYRICA Take 25 mg by mouth as directed. Take 25mg  in the morning and 75mg  at night.   senna-docusate 8.6-50 MG tablet Commonly known  as: Senokot-S Take 1 tablet by mouth daily as needed.   topiramate 25 MG tablet Commonly known as: TOPAMAX Take 25 mg by mouth daily.   Xeljanz XR 11 MG Tb24 Generic drug: Tofacitinib Citrate ER Take 11 mg by mouth daily.   ZINC 15 PO Take 1 tablet by mouth daily.        Allergies:  Allergies  Allergen Reactions   Bupivacaine Liposome Shortness Of Breath   Latex Hives, Itching, Rash and Swelling   Lisinopril Swelling and Other (See Comments)   Nsaids Other (See Comments)    Cannot have after gastric bypass   Semaglutide Nausea And Vomiting   Promethazine Other (See Comments)    Muscle spasms   Sulfa Antibiotics Rash and Other (See Comments)    Social History:  reports that she has never smoked. She has never used smokeless tobacco. She reports that she does not drink alcohol and does not use drugs.   Physical Exam: BP 112/76   Pulse (!) 106   Ht 5\' 1"  (1.549 m)   Wt 190 lb (86.2 kg)   BMI 35.90 kg/m   Constitutional:  Alert and oriented, No acute distress. HEENT: [place] AT Respiratory: Normal respiratory effort, no increased work of breathing. Psychiatric: Normal mood and affect.   Pertinent Imaging: CT and ultrasound were personally reviewed and interpreted.   US RENAL  Narrative CLINICAL DATA:  Hydronephrosis  EXAM: RENAL / URINARY TRACT ULTRASOUND COMPLETE  COMPARISON:  CT renal stone protocol July 7th 2024  FINDINGS: Right Kidney:  Renal measurements: 11.7 x 4.1 x 6.3 cm = volume: 160 mL. Echogenicity within normal limits. Mild hydronephrosis. No shadowing stones or mass visualized.  Left Kidney:  Renal measurements: 11.4 x 5.6 x 5.9 cm = volume: 196 mL. Echogenicity within normal limits. Mild hydronephrosis. No shadowing stones or mass visualized.  Bladder:  Appears normal for degree of bladder distention.  Other:  None.  IMPRESSION: Bilateral mild hydronephrosis.  No nephrolithiasis identified.   Electronically Signed By:  Jacob Moores M.D. On: 07/08/2023 08:36   CT Renal Stone Study  Narrative CLINICAL DATA:  Right flank pain  EXAM: CT ABDOMEN AND PELVIS WITHOUT CONTRAST  TECHNIQUE: Multidetector CT imaging of the abdomen and pelvis was performed following the standard protocol without IV contrast.  RADIATION DOSE REDUCTION: This exam was performed according to the departmental dose-optimization program which includes automated exposure control, adjustment of the mA and/or kV according to patient size and/or use of iterative reconstruction technique.  COMPARISON:  CT abdomen and pelvis dated September 17, 2021  FINDINGS: Lower chest: No acute abnormality.  Hepatobiliary: No focal liver abnormality is seen. No gallstones, gallbladder wall thickening, or biliary dilatation.  Pancreas: Unremarkable. No pancreatic ductal dilatation or surrounding inflammatory changes.  Spleen: Normal in size without focal abnormality.  Adrenals/Urinary Tract: Bilateral adrenal glands are unremarkable. Severe right hydronephrosis and mild hydroureter. Normal appearance of the left kidney. Bladder is decompressed.  Stomach/Bowel: Prior Roux-en-Y gastrectomy. No evidence of bowel wall thickening, distention, or inflammatory changes.  Vascular/Lymphatic: Aortic atherosclerosis. No enlarged abdominal or pelvic lymph nodes.  Reproductive: Bilateral adnexa are unremarkable.  Other: No abdominal wall hernia or abnormality. No abdominopelvic ascites.  Musculoskeletal: No acute or significant osseous findings.  IMPRESSION: 1. Severe right hydronephrosis and mild hydroureter. No obstructing stone visualized. Findings are likely due to recently passed stone. 2. Aortic Atherosclerosis (ICD10-I70.0).   Electronically Signed By: Allegra Lai M.D. On: 07/06/2023 17:59   Assessment & Plan:    1. Right hydronephrosis Moderate hydronephrosis and hydroureter to the UVJ on non-contrast CT at the time of  hospitalization. Follow-up ultrasound showed mild hydronephrosis. Her pain has persisted and will reassess with a CT urogram. Since no stone was seen, recommend CT urogram in lieu of a non-contrast CT.  We discussed possible need for cystoscopy/ureteroscopy.  She states she will not have enough pain medication to last her through the weekend and refill sent to pharmacy  2. Left flank pain No abnormalities were noted on her left side on the non-contrast CT. However, the left renal ultrasound was interpreted as mild left hydronephrosis.  This again will be reassessed on the CT urogram and will attempt to expedite scheduling.   3. Pelvic pain This is spasmodic and associated with urgency Given Gemtesa samples 75 mg daily   I have reviewed the above documentation for accuracy and completeness, and I agree with the above.   Riki Altes, MD  [facility] [address], [address] [place], [place] [address] [phone]

## 2023-07-11 ENCOUNTER — Encounter: Payer: Self-pay | Admitting: Urology

## 2023-07-11 MED ORDER — OXYCODONE-ACETAMINOPHEN 5-325 MG PO TABS: 1.0000 | ORAL_TABLET | ORAL | 0 refills | Status: AC | PRN

## 2023-07-13 ENCOUNTER — Encounter: Payer: Self-pay | Admitting: Urology

## 2023-07-17 ENCOUNTER — Ambulatory Visit
Admission: RE | Admit: 2023-07-17 | Discharge: 2023-07-17 | Disposition: A | Discharge status: Home / Self Care | Payer: 59 | Source: Ambulatory Visit | Attending: Physician Assistant | Admitting: Physician Assistant

## 2023-07-17 DIAGNOSIS — N1330 Unspecified hydronephrosis: Secondary | ICD-10-CM

## 2023-07-17 MED ORDER — IOHEXOL 300 MG/ML  SOLN
100.0000 mL | Freq: Once | INTRAMUSCULAR | Status: AC | PRN
  Administered 2023-07-17: 100 mL via INTRAVENOUS

## 2023-07-18 ENCOUNTER — Encounter: Payer: Self-pay | Admitting: *Deleted

## 2023-07-18 ENCOUNTER — Encounter: Payer: Self-pay | Admitting: Urology

## 2023-07-23 ENCOUNTER — Ambulatory Visit: Payer: Self-pay | Admitting: Urology

## 2023-07-29 ENCOUNTER — Telehealth: Payer: Self-pay | Admitting: *Deleted

## 2023-07-29 NOTE — Telephone Encounter (Signed)
Recommend scheduling office cystoscopy for evaluation of blood in the urine.  If Nichole Fuller was helping urinary symptoms can send in Rx.  If no real improvement then can discontinue    Notified patient as instructed, patient pleased. Discussed follow-up appointments, patient agrees

## 2023-08-15 ENCOUNTER — Ambulatory Visit: Payer: 59 | Admitting: Urology

## 2023-08-15 ENCOUNTER — Encounter: Payer: Self-pay | Admitting: Urology

## 2023-08-15 VITALS — BP 103/71 | HR 106 | Wt 185.0 lb

## 2023-08-15 DIAGNOSIS — N1330 Unspecified hydronephrosis: Secondary | ICD-10-CM

## 2023-08-15 DIAGNOSIS — Z87442 Personal history of urinary calculi: Secondary | ICD-10-CM

## 2023-08-15 DIAGNOSIS — R8281 Pyuria: Secondary | ICD-10-CM | POA: Diagnosis not present

## 2023-08-15 DIAGNOSIS — Z87448 Personal history of other diseases of urinary system: Secondary | ICD-10-CM

## 2023-08-15 LAB — MICROSCOPIC EXAMINATION

## 2023-08-15 LAB — URINALYSIS, COMPLETE
Bilirubin, UA: NEGATIVE
Glucose, UA: NEGATIVE
Ketones, UA: NEGATIVE
Nitrite, UA: NEGATIVE
RBC, UA: NEGATIVE
Specific Gravity, UA: 1.02 (ref 1.005–1.030)
Urobilinogen, Ur: 1 mg/dL (ref 0.2–1.0)
pH, UA: 8.5 — ABNORMAL HIGH (ref 5.0–7.5)

## 2023-08-15 NOTE — Patient Instructions (Signed)

## 2023-08-15 NOTE — Progress Notes (Signed)
   08/15/23  CC:  Chief Complaint  Patient presents with   Cysto    HPI: Refer to prior office notes.  Follow-up CT urogram showed resolution of her hydronephrosis.  She had an episode of gross hematuria late July.  UA today 6-10 WBC/calcium oxalate crystals  Blood pressure 103/71, pulse (!) 106, weight 185 lb (83.9 kg). NED. A&Ox3.   No respiratory distress   Abd soft, NT, ND Normal external genitalia with patent urethral meatus  Cystoscopy Procedure Note  Patient identification was confirmed, informed consent was obtained, and patient was prepped using Betadine solution.  Lidocaine jelly was administered per urethral meatus.    Procedure: - Flexible cystoscope introduced, without any difficulty.   - Thorough search of the bladder revealed:    normal urethral meatus    normal urothelium    no stones    no ulcers     no tumors    no urethral polyps    no trabeculation  - Ureteral orifices were normal in position and appearance.  Post-Procedure: - Patient tolerated the procedure well  Assessment/ Plan: No bladder mucosal abnormalities on cystoscopy UA with mild pyuria and calcium oxalate crystals Prior history of stone disease and recent hospitalization thought secondary to possible passed stone Recommend metabolic stone evaluation to include 24-hour urine and blood work     Riki Altes, MD

## 2023-08-25 ENCOUNTER — Encounter: Payer: Self-pay | Admitting: Urology

## 2023-09-08 ENCOUNTER — Other Ambulatory Visit: Payer: 59

## 2023-09-15 ENCOUNTER — Other Ambulatory Visit: Payer: 59

## 2023-09-15 DIAGNOSIS — N1330 Unspecified hydronephrosis: Secondary | ICD-10-CM

## 2023-09-15 IMAGING — US US ABDOMEN LIMITED
1 series · 14 of 25 positions shown · non-contrast
Comparison: CT abdomen pelvis dated May 05, 2014.

CLINICAL DATA: Epigastric pain with nausea and vomiting.

EXAM:
ULTRASOUND ABDOMEN LIMITED RIGHT UPPER QUADRANT

[Series 1: us abdomen limited ruq (liver/gb) · 14 of 38 slices shown]
[im 1/38]
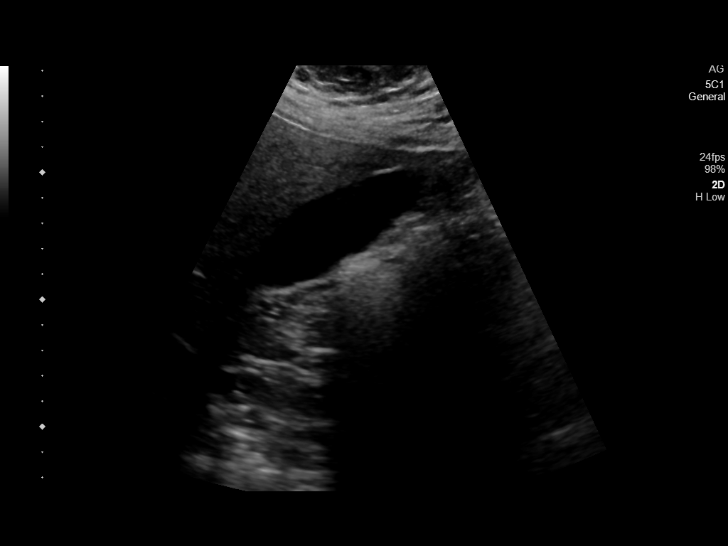
[im 4/38]
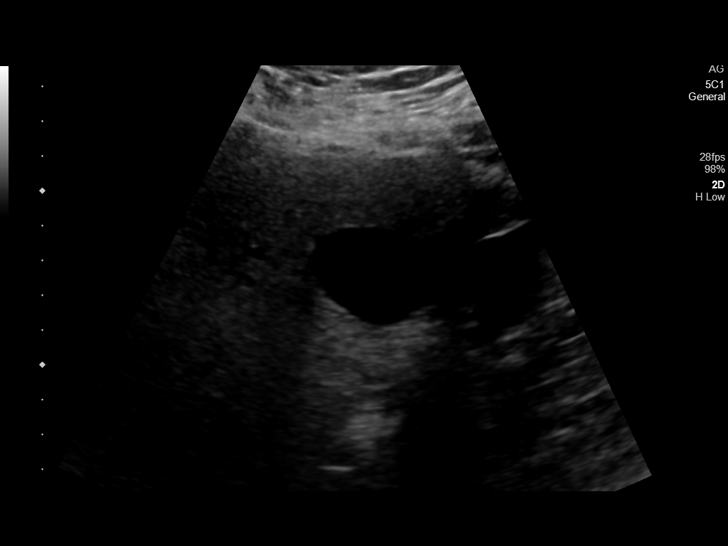
[im 7/38]
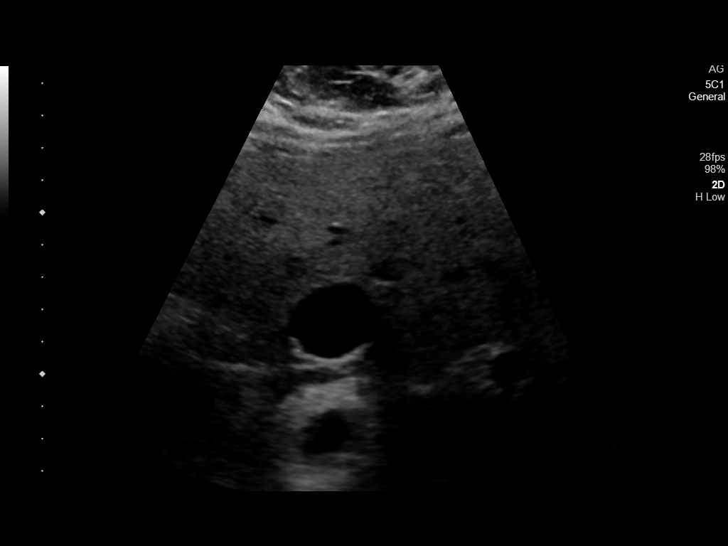
[im 10/38]
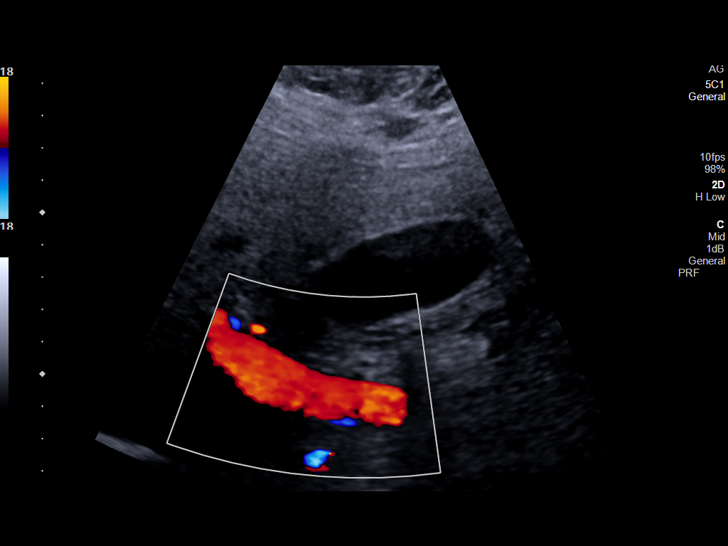
[im 13/38]
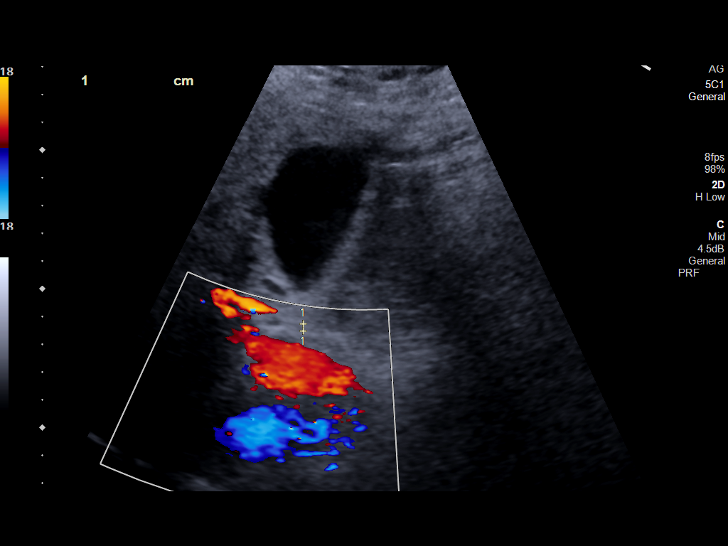
[im 14/38]
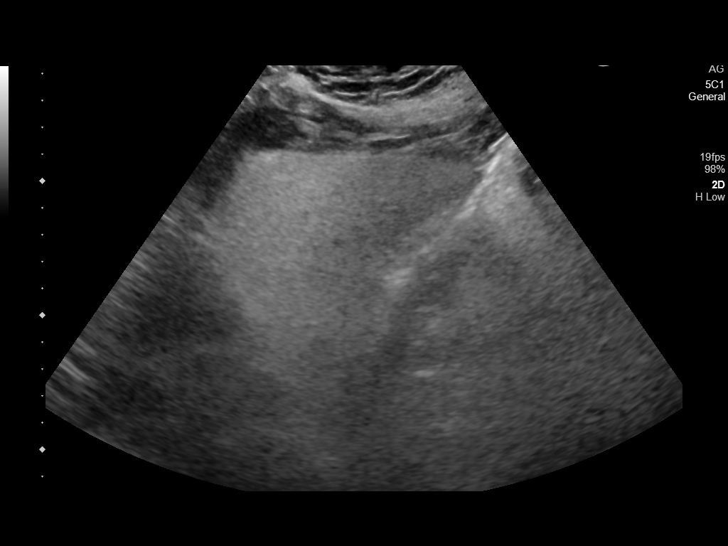
[im 17/38]
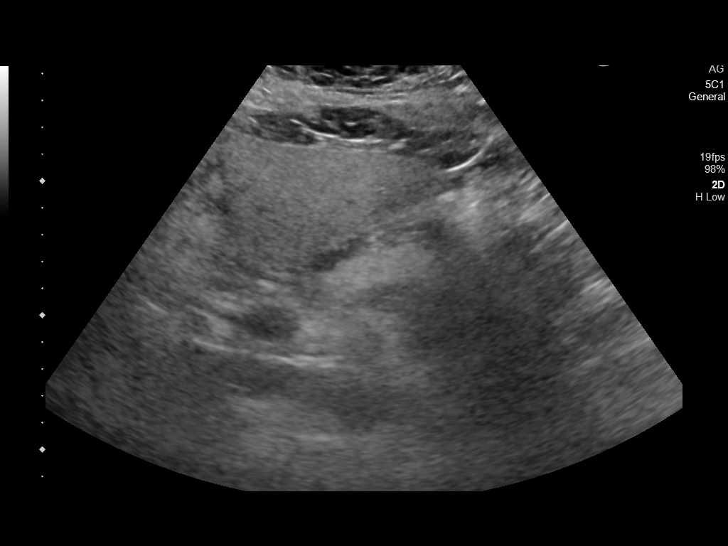
[im 21/38]
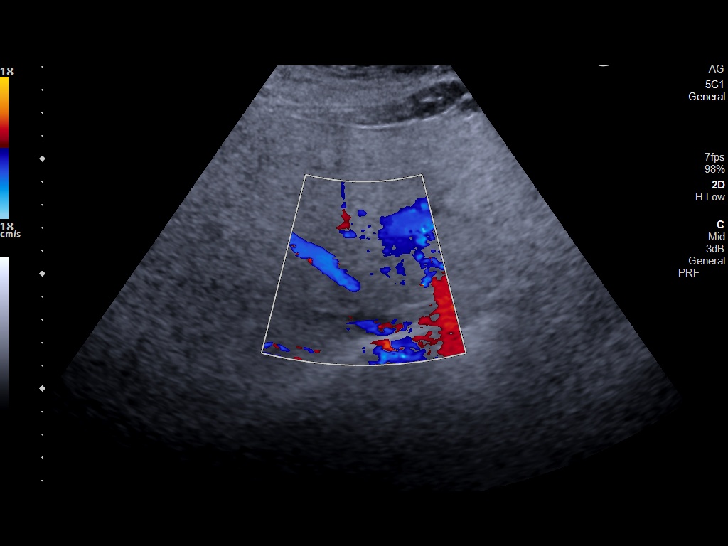
[im 24/38]
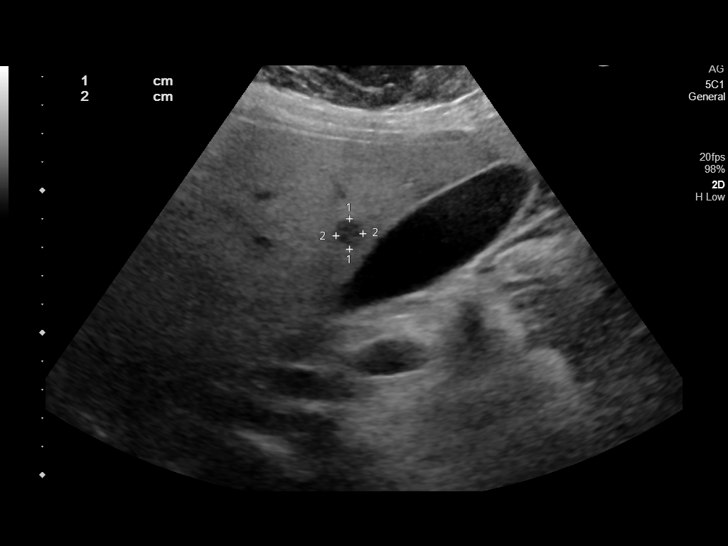
[im 25/38]
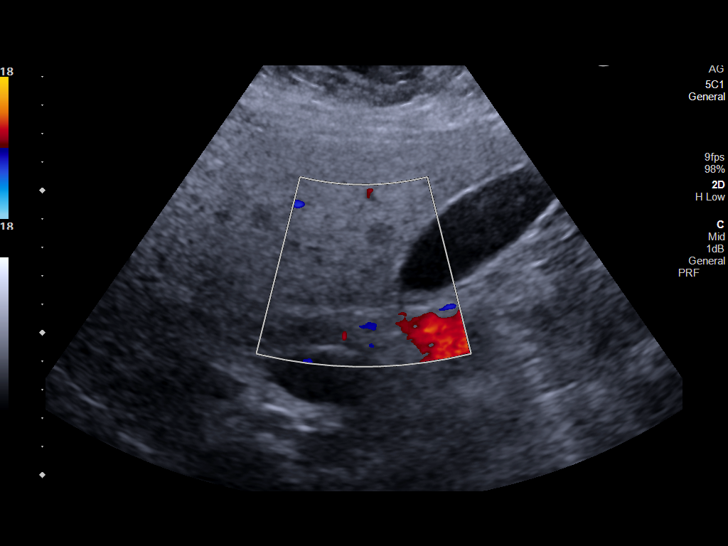
[im 28/38]
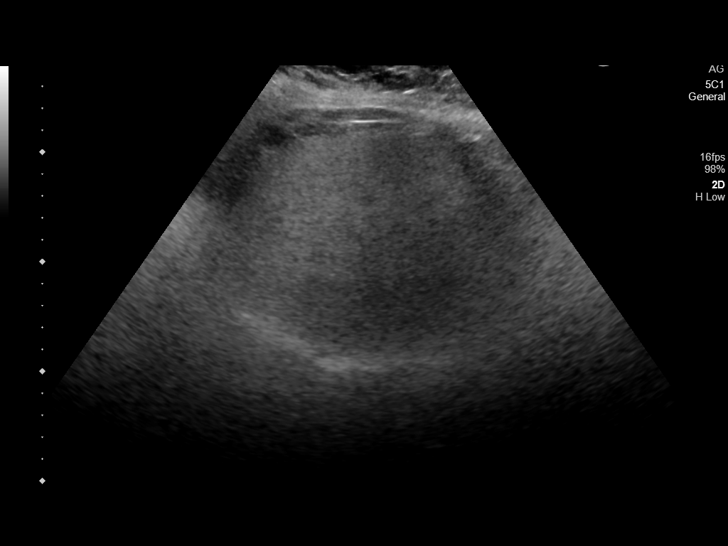
[im 31/38]
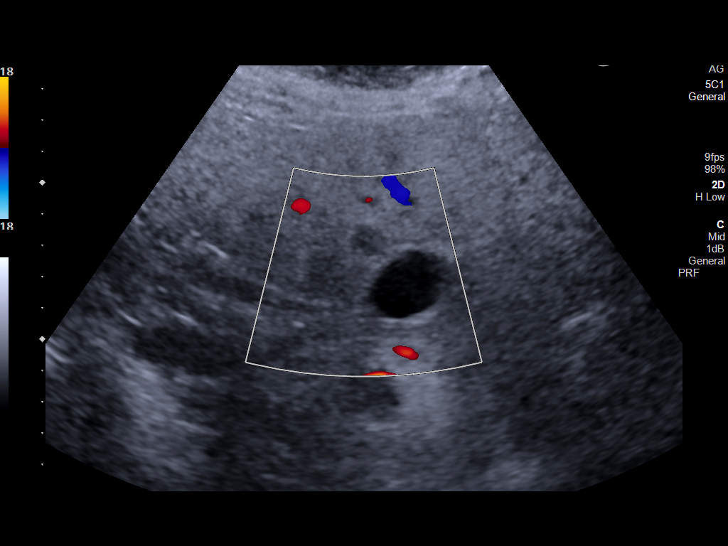
[im 34/38]
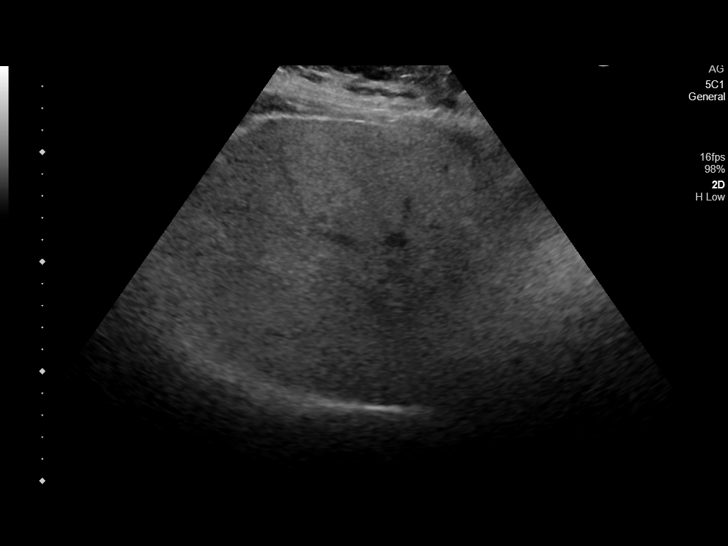
[im 38/38]
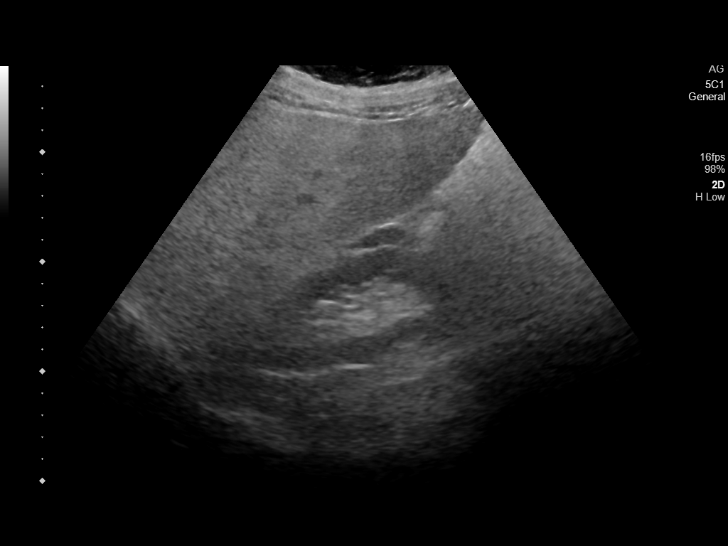

[14 of 25 positions shown; findings below may reference images not displayed]

FINDINGS: Gallbladder:

No gallstones or wall thickening visualized. No sonographic Murphy
sign noted by sonographer.

Common bile duct:

Diameter: 3 mm, normal.

Liver:

1.1 cm cyst in the right liver adjacent to the gallbladder,
unchanged since 5787. Diffusely increased in parenchymal
echogenicity. Portal vein is patent on color Doppler imaging with
normal direction of blood flow towards the liver.

Other: None.
IMPRESSION: 1. No acute abnormality.
2. Hepatic steatosis.

## 2023-09-16 LAB — LITHOLINK SERUM PANEL
CO2: 23 mmol/L (ref 20–29)
Calcium: 9.7 mg/dL (ref 8.7–10.2)
Chloride: 105 mmol/L (ref 96–106)
Creatinine, Ser: 0.82 mg/dL (ref 0.57–1.00)
Magnesium: 2.1 mg/dL (ref 1.6–2.3)
Phosphorus: 2.2 mg/dL — ABNORMAL LOW (ref 3.0–4.3)
Potassium: 4.2 mmol/L (ref 3.5–5.2)
Sodium: 143 mmol/L (ref 134–144)
Uric Acid: 4.4 mg/dL (ref 2.6–6.2)
eGFR: 89 mL/min/{1.73_m2} (ref >59)

## 2023-09-20 ENCOUNTER — Other Ambulatory Visit: Payer: Self-pay | Admitting: Urology

## 2023-09-20 DIAGNOSIS — R82994 Hypercalciuria: Secondary | ICD-10-CM

## 2023-09-20 DIAGNOSIS — R82993 Hyperuricosuria: Secondary | ICD-10-CM

## 2023-09-20 DIAGNOSIS — R82991 Hypocitraturia: Secondary | ICD-10-CM

## 2023-09-20 LAB — LITHOLINK 24HR URINE PANEL
Ammonium, Urine: 46 mmol/24 hr (ref 15–60)
Calcium Oxalate Saturation: 11.75 — ABNORMAL HIGH (ref 6.00–10.00)
Calcium Phosphate Saturation: 1.07 (ref 0.50–2.00)
Calcium, Urine: 243 mg/24 hr — ABNORMAL HIGH (ref <200)
Calcium/Creatinine Ratio: 164 mg/g creat (ref 51–262)
Calcium/Kg Body Weight: 3 mg/24 hr/kg (ref <4.0)
Chloride, Urine: 268 mmol/24 hr — ABNORMAL HIGH (ref 70–250)
Citrate, Urine: 366 mg/24 hr — ABNORMAL LOW (ref >550)
Creatinine, Urine: 1477 mg/24 hr
Creatinine/Kg Body Weight: 18.1 mg/24 hr/kg (ref 8.7–20.3)
Cystine, Urine, Qualitative: NEGATIVE
Magnesium, Urine: 67 mg/24 hr (ref 30–120)
Oxalate, Urine: 36 mg/24 hr (ref 20–40)
Phosphorus, Urine: 762 mg/24 hr (ref 600–1200)
Potassium, Urine: 33 mmol/24 hr (ref 20–100)
Protein Catabolic Rate: 1 g/kg/24 hr (ref 0.8–1.4)
Sodium, Urine: 254 mmol/24 hr — ABNORMAL HIGH (ref 50–150)
Sulfate, Urine: 32 meq/24 hr (ref 20–80)
Urea Nitrogen, Urine: 10.76 g/24 hr (ref 6.00–14.00)
Uric Acid Saturation: 2.55 — ABNORMAL HIGH (ref <1.00)
Uric Acid, Urine: 947 mg/24 hr — ABNORMAL HIGH (ref <750)
Urine Volume (Preserved): 1180 mL/24 hr (ref 500–4000)
pH, 24 hr, Urine: 5.725 — ABNORMAL LOW (ref 5.800–6.200)

## 2024-01-21 LAB — COLOGUARD: COLOGUARD: NEGATIVE

## 2024-01-21 LAB — EXTERNAL GENERIC LAB PROCEDURE: COLOGUARD: NEGATIVE

## 2024-03-28 ENCOUNTER — Emergency Department
Admission: EM | Admit: 2024-03-28 | Discharge: 2024-03-28 | Disposition: A | Discharge status: Home / Self Care | Attending: Emergency Medicine | Admitting: Emergency Medicine

## 2024-03-28 ENCOUNTER — Encounter: Payer: Self-pay | Admitting: Emergency Medicine

## 2024-03-28 DIAGNOSIS — L509 Urticaria, unspecified: Secondary | ICD-10-CM | POA: Diagnosis not present

## 2024-03-28 DIAGNOSIS — R21 Rash and other nonspecific skin eruption: Secondary | ICD-10-CM | POA: Diagnosis present

## 2024-03-28 MED ORDER — PREDNISONE 20 MG PO TABS
60.0000 mg | ORAL_TABLET | Freq: Once | ORAL | Status: AC
Start: 2024-03-28 — End: 2024-03-28
  Administered 2024-03-28: 60 mg via ORAL
  Filled 2024-03-28: qty 3

## 2024-03-28 MED ORDER — CETIRIZINE HCL 10 MG PO TABS: 10.0000 mg | ORAL_TABLET | Freq: Two times a day (BID) | ORAL | 0 refills | Status: AC

## 2024-03-28 MED ORDER — PREDNISONE 10 MG PO TABS: ORAL_TABLET | ORAL | 0 refills | Status: AC

## 2024-03-28 MED ORDER — LORATADINE 10 MG PO TABS
10.0000 mg | ORAL_TABLET | Freq: Once | ORAL | Status: AC
  Administered 2024-03-28: 10 mg via ORAL
  Filled 2024-03-28: qty 1

## 2024-03-28 MED ORDER — CETIRIZINE HCL 5 MG/5ML PO SOLN: 10.0000 mg | Freq: Once | ORAL | Status: DC

## 2024-03-28 NOTE — Discharge Instructions (Signed)
 Take cetirizine 10 mg twice daily as prescribed.  Use your prednisone as prescribed.  If your symptoms do not get better in several days call your doctor to ask for an allergy specialist referral as they can do further testing and more specialized treatment for rare cases of refractory urticaria/hives.  Keep a close eye on your symptoms and certainly return to the emergency department right away should your breathing becomes labored, or there is swelling inside of your mouth.  Thank you for choosing [place] for your health care today!  Please see your primary doctor this week for a follow up appointment.   If you have any new, worsening, or unexpected symptoms call your doctor right away or come back to the emergency department for reevaluation.  It was my pleasure to care for you today.   Daneil Dan Modesto Charon, MD

## 2024-03-28 NOTE — ED Triage Notes (Addendum)
 Patient came in pov from home complaining of rash on trunk extremities and face started yesterday morning denies any new medications or cosmetics,  denies shortness of breath or difficulty swallowing

## 2024-03-28 NOTE — ED Provider Notes (Signed)
 [facility] Provider Note    Event Date/Time   First MD Initiated Contact with Patient 03/28/24 0559     (approximate)   History   Allergic Reaction   HPI  Nichole Fuller is a 48 y.o. female   Past medical history of no significant past medical history presents emergency department with itchy rash throughout her entire body.  It started yesterday morning with several raised red lesions on her lower extremity and abdomen that were extremely itchy.  Throughout the day she developed more and when she awoke this morning it was all her face and trunk as well.  She has no respiratory complaints, GI complaints, no known new exposures.  Independent Historian contributed to assessment above: husband is at bedside to corroborate information past medical history as above       Physical Exam   Triage Vital Signs: ED Triage Vitals  Encounter Vitals Group     BP 03/28/24 0533 (!) 123/94     Systolic BP Percentile --      Diastolic BP Percentile --      Pulse Rate 03/28/24 0533 89     Resp 03/28/24 0533 16     Temp 03/28/24 0533 98.8 F (37.1 C)     Temp Source 03/28/24 0533 Oral     SpO2 03/28/24 0533 99 %     Weight --      Height --      Head Circumference --      Peak Flow --      Pain Score 03/28/24 0534 3     Pain Loc --      Pain Education --      Exclude from Growth Chart --     Most recent vital signs: Vitals:   03/28/24 0533  BP: (!) 123/94  Pulse: 89  Resp: 16  Temp: 98.8 F (37.1 C)  SpO2: 99%    General: Awake, no distress.  CV:  Good peripheral perfusion.  Resp:  Normal effort.  Abd:  No distention.  Other:  She has a diffuse raised erythematous rash discrete islands of rash across her trunk bilateral upper and lower extremities, back, some on her face on the external lips.  No intraoral lesions or swelling.  Breathing comfortably with no wheezing on auscultation, soft benign abdominal exam.   ED Results / Procedures /  Treatments   Labs (all labs ordered are listed, but only abnormal results are displayed) Labs Reviewed - No data to display   PROCEDURES:  Critical Care performed: No  Procedures   MEDICATIONS ORDERED IN ED: Medications  predniSONE (DELTASONE) tablet 60 mg (60 mg Oral Given 03/28/24 0624)  loratadine (CLARITIN) tablet 10 mg (10 mg Oral Given 03/28/24 1610)     IMPRESSION / MDM / ASSESSMENT AND PLAN / ED COURSE  I reviewed the triage vital signs and the nursing notes.                                Patient's presentation is most consistent with acute complicated illness / injury requiring diagnostic workup.  Differential diagnosis includes, but is not limited to, urticaria, allergic reaction, angioedema, considered but less likely anaphylaxis    MDM: Urticarial rash over the last 2 days that has been refractory to Benadryl that she has been taking several doses.  No known new exposures and knows 2 systems involvement and certainly no airway involvement to  suggest anaphylaxis, angioedema.  I considered insect bites or bedbugs but she has checked her bedding and sees no evidence of this.  I will give her a prednisone taper as well as advised on second-generation antihistamine and given prescription for the same.  I let her know that if her symptoms do not resolve she can follow-up with her primary doctor for allergy referral as needed.  She can return to the emergency department if any new or worsening symptoms or certainly any airway involvement difficulty breathing etc.       FINAL CLINICAL IMPRESSION(S) / ED DIAGNOSES   Final diagnoses:  Urticaria     Rx / DC Orders   ED Discharge Orders          Ordered    predniSONE (DELTASONE) 10 MG tablet  Daily        03/28/24 0618    cetirizine (ZYRTEC ALLERGY) 10 MG tablet  2 times daily        03/28/24 4098             Note:  This document was prepared using Dragon voice recognition software and may include  unintentional dictation errors.    Pilar Jarvis, MD 03/28/24 [phone]

## 2024-04-07 ENCOUNTER — Other Ambulatory Visit: Payer: Self-pay

## 2024-04-07 ENCOUNTER — Emergency Department

## 2024-04-07 ENCOUNTER — Emergency Department
Admission: EM | Admit: 2024-04-07 | Discharge: 2024-04-07 | Disposition: A | Discharge status: Other Acute Inpatient Hospital | Attending: Emergency Medicine | Admitting: Emergency Medicine

## 2024-04-07 DIAGNOSIS — R1013 Epigastric pain: Secondary | ICD-10-CM | POA: Insufficient documentation

## 2024-04-07 DIAGNOSIS — R1011 Right upper quadrant pain: Secondary | ICD-10-CM | POA: Insufficient documentation

## 2024-04-07 DIAGNOSIS — R079 Chest pain, unspecified: Secondary | ICD-10-CM | POA: Diagnosis not present

## 2024-04-07 LAB — CBC WITH DIFFERENTIAL/PLATELET
Abs Immature Granulocytes: 0.05 10*3/uL (ref 0.00–0.07)
Basophils Absolute: 0.1 10*3/uL (ref 0.0–0.1)
Basophils Relative: 1 %
Eosinophils Absolute: 0.1 10*3/uL (ref 0.0–0.5)
Eosinophils Relative: 2 %
HCT: 47.8 % — ABNORMAL HIGH (ref 36.0–46.0)
Hemoglobin: 16 g/dL — ABNORMAL HIGH (ref 12.0–15.0)
Immature Granulocytes: 1 %
Lymphocytes Relative: 25 %
Lymphs Abs: 1.8 10*3/uL (ref 0.7–4.0)
MCH: 29 pg (ref 26.0–34.0)
MCHC: 33.5 g/dL (ref 30.0–36.0)
MCV: 86.6 fL (ref 80.0–100.0)
Monocytes Absolute: 0.3 10*3/uL (ref 0.1–1.0)
Monocytes Relative: 5 %
Neutro Abs: 4.8 10*3/uL (ref 1.7–7.7)
Neutrophils Relative %: 66 %
Platelets: 321 10*3/uL (ref 150–400)
RBC: 5.52 MIL/uL — ABNORMAL HIGH (ref 3.87–5.11)
RDW: 13.5 % (ref 11.5–15.5)
WBC: 7.2 10*3/uL (ref 4.0–10.5)
nRBC: 0 % (ref 0.0–0.2)

## 2024-04-07 LAB — TYPE AND SCREEN
ABO/RH(D): O POS
Antibody Screen: NEGATIVE

## 2024-04-07 LAB — COMPREHENSIVE METABOLIC PANEL WITH GFR
ALT: 68 U/L — ABNORMAL HIGH (ref 0–44)
AST: 33 U/L (ref 15–41)
Albumin: 3.5 g/dL (ref 3.5–5.0)
Alkaline Phosphatase: 57 U/L (ref 38–126)
Anion gap: 5 (ref 5–15)
BUN: 13 mg/dL (ref 6–20)
CO2: 25 mmol/L (ref 22–32)
Calcium: 8.8 mg/dL — ABNORMAL LOW (ref 8.9–10.3)
Chloride: 108 mmol/L (ref 98–111)
Creatinine, Ser: 0.73 mg/dL (ref 0.44–1.00)
GFR, Estimated: >60 mL/min (ref >60)
Glucose, Bld: 123 mg/dL — ABNORMAL HIGH (ref 70–99)
Potassium: 4.2 mmol/L (ref 3.5–5.1)
Sodium: 138 mmol/L (ref 135–145)
Total Bilirubin: 0.9 mg/dL (ref 0.0–1.2)
Total Protein: 6.1 g/dL — ABNORMAL LOW (ref 6.5–8.1)

## 2024-04-07 LAB — APTT: aPTT: 28 s (ref 24–36)

## 2024-04-07 LAB — PROTIME-INR
INR: 1 (ref 0.8–1.2)
Prothrombin Time: 13.3 s (ref 11.4–15.2)

## 2024-04-07 LAB — LIPASE, BLOOD: Lipase: 150 U/L — ABNORMAL HIGH (ref 11–51)

## 2024-04-07 LAB — TROPONIN I (HIGH SENSITIVITY)
Troponin I (High Sensitivity): <2 ng/L (ref <18)
Troponin I (High Sensitivity): <2 ng/L (ref <18)

## 2024-04-07 MED ORDER — MORPHINE SULFATE (PF) 4 MG/ML IV SOLN
4.0000 mg | Freq: Once | INTRAVENOUS | Status: AC
  Administered 2024-04-07: 4 mg via INTRAVENOUS
  Filled 2024-04-07: qty 1

## 2024-04-07 MED ORDER — PIPERACILLIN-TAZOBACTAM 3.375 G IVPB
3.3750 g | Freq: Once | INTRAVENOUS | Status: AC
  Administered 2024-04-07: 3.375 g via INTRAVENOUS
  Filled 2024-04-07: qty 50

## 2024-04-07 MED ORDER — LACTATED RINGERS IV BOLUS
2000.0000 mL | Freq: Once | INTRAVENOUS | Status: AC
  Administered 2024-04-07: 1000 mL via INTRAVENOUS

## 2024-04-07 MED ORDER — HYDROMORPHONE HCL 1 MG/ML IJ SOLN: 1.0000 mg | Freq: Once | INTRAMUSCULAR | Status: AC

## 2024-04-07 MED ORDER — IOHEXOL 300 MG/ML  SOLN
100.0000 mL | Freq: Once | INTRAMUSCULAR | Status: AC | PRN
  Administered 2024-04-07: 100 mL via INTRAVENOUS

## 2024-04-07 MED ORDER — THIAMINE HCL 100 MG/ML IJ SOLN: 100.0000 mg | Freq: Once | INTRAMUSCULAR | Status: DC

## 2024-04-07 MED ORDER — HYDROMORPHONE HCL 1 MG/ML IJ SOLN
INTRAMUSCULAR | Status: AC
  Administered 2024-04-07: 1 mg via INTRAVENOUS
  Filled 2024-04-07: qty 1

## 2024-04-07 MED ORDER — ONDANSETRON HCL 4 MG/2ML IJ SOLN
4.0000 mg | Freq: Once | INTRAMUSCULAR | Status: AC
  Administered 2024-04-07: 4 mg via INTRAVENOUS
  Filled 2024-04-07: qty 2

## 2024-04-07 MED ORDER — ONDANSETRON HCL 4 MG/2ML IJ SOLN
INTRAMUSCULAR | Status: AC
  Administered 2024-04-07: 4 mg via INTRAVENOUS
  Filled 2024-04-07: qty 2

## 2024-04-07 MED ORDER — HYDROMORPHONE HCL 1 MG/ML IJ SOLN
1.0000 mg | Freq: Once | INTRAMUSCULAR | Status: AC
  Administered 2024-04-07: 1 mg via INTRAVENOUS
  Filled 2024-04-07: qty 1

## 2024-04-07 MED ORDER — ONDANSETRON HCL 4 MG/2ML IJ SOLN: 4.0000 mg | Freq: Once | INTRAMUSCULAR | Status: AC

## 2024-04-07 MED ORDER — FLUCONAZOLE IN SODIUM CHLORIDE 400-0.9 MG/200ML-% IV SOLN
800.0000 mg | Freq: Once | INTRAVENOUS | Status: DC
  Filled 2024-04-07: qty 400

## 2024-04-07 NOTE — ED Notes (Signed)
 This RN received report from Ovidio Kin RN and performed care handoff. This RN introduced self to pt. Call light in reach, bed wheels locked, side rail raised, pt updated on plan of care. Rounding completed.

## 2024-04-07 NOTE — ED Triage Notes (Signed)
 Pt presenting to ED with epigastric and midsternal chest pain since around 0400. Denies SOB. Denies nausea at this time but was nauseous earlier. Denies cardiac hx.   Received 324mg  ASA and 4mg  zofran en route

## 2024-04-07 NOTE — ED Provider Notes (Signed)
 [facility] Provider Note    Event Date/Time   First MD Initiated Contact with Patient 04/07/24 [phone]     (approximate)   History   Chest Pain   HPI  Nichole Fuller is a 48 y.o. female   Past medical history of kidney stone and RA who presents to the emergency department with epigastric pain.  It awoke her from sleep last night.  She had otherwise been in her regular state of health with no other acute medical complaints.  It is pain in the epigastrium that radiates into the lower chest associated with nausea but no vomiting.  Denies GI bleeding.  Denies any GU symptoms.  Had a remote appendectomy and hysterectomy.  Independent Historian contributed to assessment above: Husband at bedside corroborates information past medical history as above    Physical Exam   Triage Vital Signs: ED Triage Vitals  Encounter Vitals Group     BP 04/07/24 0503 128/89     Systolic BP Percentile --      Diastolic BP Percentile --      Pulse Rate 04/07/24 0503 79     Resp 04/07/24 0503 12     Temp 04/07/24 0503 98 F (36.7 C)     Temp Source 04/07/24 0503 Oral     SpO2 04/07/24 0503 97 %     Weight 04/07/24 0504 163 lb (73.9 kg)     Height 04/07/24 0504 5\' 1"  (1.549 m)     Head Circumference --      Peak Flow --      Pain Score 04/07/24 0504 8     Pain Loc --      Pain Education --      Exclude from Growth Chart --     Most recent vital signs: Vitals:   04/07/24 0632 04/07/24 0700  BP: 103/68 101/63  Pulse: 76 66  Resp: (!) 21 (!) 22  Temp:    SpO2: 100% 100%    General: Awake, no distress.  CV:  Good peripheral perfusion.  Resp:  Normal effort. Abd:  No distention.  Other:  Epigastric tenderness to palpation in right upper quadrant tenderness as well.  No rigidity or guarding.  No fever.   ED Results / Procedures / Treatments   Labs (all labs ordered are listed, but only abnormal results are displayed) Labs Reviewed  COMPREHENSIVE METABOLIC  PANEL WITH GFR - Abnormal; Notable for the following components:      Result Value   Glucose, Bld 123 (*)    Calcium 8.8 (*)    Total Protein 6.1 (*)    ALT 68 (*)    All other components within normal limits  LIPASE, BLOOD - Abnormal; Notable for the following components:   Lipase 150 (*)    All other components within normal limits  CBC WITH DIFFERENTIAL/PLATELET - Abnormal; Notable for the following components:   RBC 5.52 (*)    Hemoglobin 16.0 (*)    HCT 47.8 (*)    All other components within normal limits  TROPONIN I (HIGH SENSITIVITY)  TROPONIN I (HIGH SENSITIVITY)     I ordered and reviewed the above labs they are notable for lipase is 150.  No leukocytosis.  EKG  ED ECG REPORT I, Pilar Jarvis, the attending physician, personally viewed and interpreted this ECG.   Date: 04/07/2024  EKG Time: 0504  Rate: 79  Rhythm: sinus  Axis: nl  Intervals:none  ST&T Change: no stemi  RADIOLOGY I independently reviewed and interpreted chest x-ray and I see no obvious focality pneumothorax I also reviewed radiologist's formal read.   PROCEDURES:  Critical Care performed: No  Procedures   MEDICATIONS ORDERED IN ED: Medications  morphine (PF) 4 MG/ML injection 4 mg (4 mg Intravenous Given 04/07/24 0629)  ondansetron (ZOFRAN) injection 4 mg (4 mg Intravenous Given 04/07/24 0629)  iohexol (OMNIPAQUE) 300 MG/ML solution 100 mL (100 mLs Intravenous Contrast Given 04/07/24 0723)     IMPRESSION / MDM / ASSESSMENT AND PLAN / ED COURSE  I reviewed the triage vital signs and the nursing notes.                                Patient's presentation is most consistent with acute presentation with potential threat to life or bodily function.  Differential diagnosis includes, but is not limited to, cholecystitis, biliary colic, pancreatitis, gastritis, AC     The patient is on the cardiac monitor to evaluate for evidence of arrhythmia and/or significant heart rate  changes.  MDM:    Epigastric pain with a mildly elevated lipase could represent pancreatitis or gallstone pancreatitis or cholecystitis, will get a CT scan of the abdomen pelvis for further evaluation.  Considered ACS but I think less likely given the obvious epigastric tenderness to palpation.  EKG looks nonischemic will follow-up with troponins.  Given IV morphine and IV Zofran for pain control and antiemetic.  Signed out pending CT scan and lab testing to the oncoming provider at 7 AM.   Clinical Course as of 04/07/24 0740  Wed Apr 07, 2024  0722 Signout from Dr. Modesto Charon: 48 year old female presenting with epigastric and right upper quadrant pain that does radiate somewhat chest, initial troponin undetectable, labs notable for moderately elevated lipase and LFTs.  CT abdomen pelvis pending.  TO DO: -Follow-up CTAP - re-eval [MM]    Clinical Course User Index [MM] Marinell Blight, MD     FINAL CLINICAL IMPRESSION(S) / ED DIAGNOSES   Final diagnoses:  Nonspecific chest pain  Epigastric pain     Rx / DC Orders   ED Discharge Orders     None        Note:  This document was prepared using Dragon voice recognition software and may include unintentional dictation errors.    Pilar Jarvis, MD 04/07/24 [phone]

## 2024-04-07 NOTE — ED Notes (Signed)
 [facility]  called  to  transport  pt  to  [facility]  emergency  dept

## 2024-04-07 NOTE — ED Notes (Signed)
 Started an transfer to [facility] upon request of Doctor Melynda Keller for this patient to General Surgery. Talked with Zenaida Niece at the transfer center about transfer. Power shared the images and faxed over face sheet

## 2024-04-07 NOTE — ED Provider Notes (Signed)
 Sent from Dr. Modesto Charon.  See updated ED course below.  Transferred to [facility] for management of pneumoperitoneum in the setting of apparent perforation at site of prior bypass anastomosis. Physical Exam  BP 118/71 (BP Location: Right Arm)   Pulse 76   Temp 97.7 F (36.5 C) (Oral)   Resp 14   Ht 5\' 1"  (1.549 m)   Wt 73.9 kg   SpO2 96%   BMI 30.80 kg/m   Physical Exam  Procedures  Procedures  ED Course / MDM   Clinical Course as of 04/07/24 1554  Wed Apr 07, 2024  0722 Signout from Dr. Modesto Charon: 48 year old female presenting with epigastric and right upper quadrant pain that does radiate somewhat chest, initial troponin undetectable, labs notable for moderately elevated lipase and LFTs.  CT abdomen pelvis pending.  TO DO: -Follow-up CTAP - re-eval [MM]  [phone] Call from radiology, pneumoperitoneum noted on CT abdomen pelvis.  Appears to be at site of anastomosis from prior gastric bypass.  Will give Zosyn, update patient, and talk with general surgery. [MM]  [phone] Discussed with Laqueta Due PA of general surgery, not sure at this point whether patient will be kept here or transferred to the site of her prior bariatric surgery--will discuss with on-call surgeon and get back to me shortly  Patient updated about plan [MM]  [phone] Interpreted CT abdomen pelvis with pneumoperitoneum in agreement with radiology read below  IMPRESSION: Bowel perforation with moderate pneumoperitoneum, source is likely a marginal ulcer in the superior wall of small bowel just beyond the gastroenteric anastomosis.   [MM]  [phone] General surgery recommends transfer to [facility] (primary bariatric surgeon was Dr. Ferd Glassing, 10/23/22) --ED clerk initiating process [MM]  [phone] D/w Dr. Yetta Flock of [facility] bariatric surg -Agrees with transfer - fluconazole - 2L LR - 100mg  IV thiamine - no beds, ed-to-ed   [MM]  [phone] D/w Dr. Sherryle Lis of [facility] ed, accepts for transfer [MM]    Clinical Course User Index [MM] Marinell Blight, MD    Medical Decision Making Amount and/or Complexity of Data Reviewed Labs: ordered. Radiology: ordered.  Risk Prescription drug management.          Marinell Blight, MD 04/07/24 [phone]

## 2024-04-23 ENCOUNTER — Encounter: Payer: Self-pay | Admitting: Emergency Medicine

## 2024-04-23 ENCOUNTER — Emergency Department

## 2024-04-23 ENCOUNTER — Other Ambulatory Visit: Payer: Self-pay

## 2024-04-23 ENCOUNTER — Emergency Department
Admission: EM | Admit: 2024-04-23 | Discharge: 2024-04-24 | Disposition: A | Discharge status: Home / Self Care | Attending: Emergency Medicine | Admitting: Emergency Medicine

## 2024-04-23 DIAGNOSIS — N200 Calculus of kidney: Secondary | ICD-10-CM

## 2024-04-23 DIAGNOSIS — N132 Hydronephrosis with renal and ureteral calculous obstruction: Secondary | ICD-10-CM | POA: Diagnosis not present

## 2024-04-23 DIAGNOSIS — R1012 Left upper quadrant pain: Secondary | ICD-10-CM | POA: Diagnosis present

## 2024-04-23 LAB — CBC
HCT: 41.7 % (ref 36.0–46.0)
Hemoglobin: 13.9 g/dL (ref 12.0–15.0)
MCH: 29.3 pg (ref 26.0–34.0)
MCHC: 33.3 g/dL (ref 30.0–36.0)
MCV: 88 fL (ref 80.0–100.0)
Platelets: 290 10*3/uL (ref 150–400)
RBC: 4.74 MIL/uL (ref 3.87–5.11)
RDW: 13.2 % (ref 11.5–15.5)
WBC: 6.3 10*3/uL (ref 4.0–10.5)
nRBC: 0 % (ref 0.0–0.2)

## 2024-04-23 LAB — COMPREHENSIVE METABOLIC PANEL WITH GFR
ALT: 53 U/L — ABNORMAL HIGH (ref 0–44)
AST: 32 U/L (ref 15–41)
Albumin: 3.7 g/dL (ref 3.5–5.0)
Alkaline Phosphatase: 66 U/L (ref 38–126)
Anion gap: 10 (ref 5–15)
BUN: 18 mg/dL (ref 6–20)
CO2: 24 mmol/L (ref 22–32)
Calcium: 8.8 mg/dL — ABNORMAL LOW (ref 8.9–10.3)
Chloride: 108 mmol/L (ref 98–111)
Creatinine, Ser: 0.84 mg/dL (ref 0.44–1.00)
GFR, Estimated: >60 mL/min (ref >60)
Glucose, Bld: 129 mg/dL — ABNORMAL HIGH (ref 70–99)
Potassium: 3.8 mmol/L (ref 3.5–5.1)
Sodium: 142 mmol/L (ref 135–145)
Total Bilirubin: 0.8 mg/dL (ref 0.0–1.2)
Total Protein: 6.7 g/dL (ref 6.5–8.1)

## 2024-04-23 LAB — URINALYSIS, ROUTINE W REFLEX MICROSCOPIC
Bacteria, UA: NONE SEEN
Bilirubin Urine: NEGATIVE
Glucose, UA: NEGATIVE mg/dL
Ketones, ur: NEGATIVE mg/dL
Leukocytes,Ua: NEGATIVE
Nitrite: NEGATIVE
Protein, ur: 100 mg/dL — AB
RBC / HPF: >50 RBC/hpf (ref 0–5)
Specific Gravity, Urine: 1.021 (ref 1.005–1.030)
pH: 8 (ref 5.0–8.0)

## 2024-04-23 LAB — LIPASE, BLOOD: Lipase: 34 U/L (ref 11–51)

## 2024-04-23 MED ORDER — FENTANYL CITRATE PF 50 MCG/ML IJ SOSY
50.0000 ug | PREFILLED_SYRINGE | INTRAMUSCULAR | Status: AC | PRN
  Administered 2024-04-23 (×2): 50 ug via INTRAVENOUS
  Filled 2024-04-23 (×2): qty 1

## 2024-04-23 MED ORDER — IOHEXOL 300 MG/ML  SOLN
100.0000 mL | Freq: Once | INTRAMUSCULAR | Status: AC | PRN
  Administered 2024-04-23: 100 mL via INTRAVENOUS

## 2024-04-23 MED ORDER — ONDANSETRON HCL 4 MG/2ML IJ SOLN
4.0000 mg | Freq: Once | INTRAMUSCULAR | Status: AC
  Administered 2024-04-23: 4 mg via INTRAVENOUS
  Filled 2024-04-23: qty 2

## 2024-04-23 MED ORDER — SODIUM CHLORIDE 0.9 % IV BOLUS
500.0000 mL | Freq: Once | INTRAVENOUS | Status: AC
  Administered 2024-04-23: 500 mL via INTRAVENOUS

## 2024-04-23 MED ORDER — ONDANSETRON HCL 4 MG/2ML IJ SOLN
4.0000 mg | INTRAMUSCULAR | Status: AC
  Administered 2024-04-23: 4 mg via INTRAVENOUS
  Filled 2024-04-23: qty 2

## 2024-04-23 MED ORDER — MORPHINE SULFATE (PF) 4 MG/ML IV SOLN
4.0000 mg | Freq: Once | INTRAVENOUS | Status: AC
  Administered 2024-04-23: 4 mg via INTRAVENOUS
  Filled 2024-04-23: qty 1

## 2024-04-23 MED ORDER — MORPHINE SULFATE (PF) 4 MG/ML IV SOLN
4.0000 mg | Freq: Once | INTRAVENOUS | Status: AC
  Administered 2024-04-24: 4 mg via INTRAVENOUS
  Filled 2024-04-23: qty 1

## 2024-04-23 NOTE — ED Provider Notes (Signed)
 [facility] Provider Note    Event Date/Time   First MD Initiated Contact with Patient 04/23/24 2303     (approximate)   History   Flank Pain and Abdominal Pain   HPI  Nichole Fuller is a 48 y.o. female history of perforation status post gastric bypass.  2-1/2 hours ago began having a fairly sudden severe left flank and left upper quadrant now more all over abdominal pain.  No fevers slight nausea no vomiting.  Loose bowel movements which have been present since the time of her surgery.  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 She had a perforated marginal ulcer that was repaired with laparoscopic washout and omental patch on 04/07/2024 '   No fevers or chills.  Her surgical wounds been healing well.  No pain or burning with urination.  Total hysterectomy  Physical Exam   Triage Vital Signs: ED Triage Vitals  Encounter Vitals Group     BP 04/23/24 2118 (!) 157/97     Systolic BP Percentile --      Diastolic BP Percentile --      Pulse Rate 04/23/24 2118 83     Resp 04/23/24 2118 (!) 24     Temp 04/23/24 2118 97.6 F (36.4 C)     Temp Source 04/23/24 2118 Oral     SpO2 04/23/24 2118 97 %     Weight 04/23/24 2119 163 lb (73.9 kg)     Height --      Head Circumference --      Peak Flow --      Pain Score 04/23/24 2119 10     Pain Loc --      Pain Education --      Exclude from Growth Chart --     Most recent vital signs: Vitals:   04/23/24 2345 04/24/24 0000  BP: (!) 141/86 (!) 146/76  Pulse: (!) 59 65  Resp: 18   Temp:    SpO2: 99% 100%     General: Awake, laying on her side in painful distress.  Appears in painful distress but fully alert and oriented.  Family at the  bedside very pleasant CV:  Good peripheral perfusion.  Resp:  Normal effort.  Abd:  No distention.  The abdomen is exquisitely tender in any periumbilical left mid and epigastric left upper quadrant.  No pain to noted in the right lower quadrant or right mid abdomen.  She does not have active emesis Other:  Skin is warm and well-perfused   ED Results / Procedures / Treatments   Labs (all labs ordered are listed, but only abnormal results are displayed) Labs Reviewed  COMPREHENSIVE METABOLIC PANEL WITH GFR - Abnormal; Notable for the following components:      Result Value   Glucose, Bld 129 (*)    Calcium 8.8 (*)    ALT 53 (*)    All other components within normal limits  URINALYSIS, ROUTINE W REFLEX MICROSCOPIC - Abnormal; Notable for the following components:   Color, Urine YELLOW (*)    APPearance CLOUDY (*)    Hgb urine dipstick LARGE (*)    Protein, ur 100 (*)    All other components within normal limits  LIPASE,  BLOOD  CBC     EKG     RADIOLOGY  CT imaging interpreted by me as grossly negative for free air or obvious perforation  CT ABDOMEN PELVIS W CONTRAST Result Date: 04/23/2024 CLINICAL DATA:  LUQ abdominal pain suspect perforation, acute LUQ abd pain post perforation and repair at [facility] 2 weeks ago EXAM: CT ABDOMEN AND PELVIS WITH CONTRAST TECHNIQUE: Multidetector CT imaging of the abdomen and pelvis was performed using the standard protocol following bolus administration of intravenous contrast. RADIATION DOSE REDUCTION: This exam was performed according to the departmental dose-optimization program which includes automated exposure control, adjustment of the mA and/or kV according to patient size and/or use of iterative reconstruction technique. CONTRAST:  OMNIPAQUE  IOHEXOL  300 MG/ML  SOLN COMPARISON:  None Available. FINDINGS: Lower chest: No acute abnormality. Hepatobiliary: Vague hypodensity of the falciform ligament suggestive of focal fatty infiltration.  No gallstones, gallbladder wall thickening, or pericholecystic fluid. No biliary dilatation. Pancreas: No focal lesion. Normal pancreatic contour. No surrounding inflammatory changes. No main pancreatic ductal dilatation. Spleen: Normal in size without focal abnormality. Adrenals/Urinary Tract: No adrenal nodule bilaterally. Delayed left nephrogram. 3 mm left ureteropelvic junction stone. Associated proximal mild left hydronephrosis. No left ureterolithiasis more distally. No left hydroureter. No right hydronephrosis. No right nephroureterolithiasis. The urinary bladder is unremarkable. On delayed imaging, there is no urothelial wall thickening and there are no filling defects in the opacified portions of the right collecting system or ureter. No excretion of intravenous contrast from the left kidney on delayed view. Stomach/Bowel: Roux-en-Y gastric bypass surgical changes noted. Stomach is within normal limits. No evidence of bowel wall thickening or dilatation. Status post appendectomy. Vascular/Lymphatic: No abdominal aorta or iliac aneurysm. No abdominal, pelvic, or inguinal lymphadenopathy. Reproductive: Status post hysterectomy. No adnexal masses. Other: No intraperitoneal free fluid. No intraperitoneal free gas. No organized fluid collection. Musculoskeletal: No abdominal wall hernia or abnormality. No suspicious lytic or blastic osseous lesions. No acute displaced fracture. IMPRESSION: 1. Obstructive 3 mm left ureteropelvic junction stone. 2. Roux-en-Y gastric bypass, appendectomy, hysterectomy. 3.  Aortic Atherosclerosis (ICD10-I70.0). Electronically Signed   By: Morgane  Naveau M.D.   On: 04/23/2024 23:51        PROCEDURES:  Critical Care performed: No  Procedures   MEDICATIONS ORDERED IN ED: Medications  fentaNYL  (SUBLIMAZE ) injection 50 mcg (50 mcg Intravenous Given 04/23/24 2236)  ondansetron  (ZOFRAN ) injection 4 mg (4 mg Intravenous Given 04/23/24 2130)  morphine  (PF) 4 MG/ML injection  4 mg (4 mg Intravenous Given 04/23/24 2326)  ondansetron  (ZOFRAN ) injection 4 mg (4 mg Intravenous Given 04/23/24 2326)  sodium chloride  0.9 % bolus 500 mL (0 mLs Intravenous Stopped 04/24/24 0019)  iohexol  (OMNIPAQUE ) 300 MG/ML solution 100 mL (100 mLs Intravenous Contrast Given 04/23/24 2328)  morphine  (PF) 4 MG/ML injection 4 mg (4 mg Intravenous Given 04/24/24 0018)     IMPRESSION / MDM / ASSESSMENT AND PLAN / ED COURSE  I reviewed the triage vital signs and the nursing notes.                              Differential diagnosis includes but is not limited to, abdominal perforation, aortic dissection, cholecystitis, appendicitis, diverticulitis, colitis, esophagitis/gastritis, kidney stone, pyelonephritis, urinary tract infection, aortic aneurysm. All are considered in decision and treatment plan. Based upon the patient's presentation and risk factors, and given her recent perforation with repair a very high concern for recurrent perforation or acute surgical complication  is noted.  Will keep her n.p.o. at this time.  Will proceed with a CT imaging with and without contrast, pain medication, antiemetic IV hydration.  Patient agreeable with this plan.  Her symptoms seem to be very much acute intra-abdominal in nature primarily starting over the left now rating towards the mid abdomen and llq.  No acute cardio pulmonary neurologic or vascular symptoms are noted   Patient's presentation is most consistent with acute presentation with potential threat to life or bodily function.  Based on initial evaluation high concern for acute perforation  ----------------------------------------- 12:54 AM on 04/24/2024 ----------------------------------------- Thankfully the patient's pain is much better now and well-controlled.  Her CT imaging reveals a left-sided kidney stone that is small and would expect expectant management.  The patient reports she has had kidney stones in the past follow-up with  [facility] urologic and this also feels similar.  Thankfully there is no evidence that supports an acute surgical emergency or perforation, and given her left-sided symptoms and imaging as well as hematuria this seems consistent with the acute kidney stone/nephrolithiasis  Pain well-controlled.  She is not driving she will be able to utilize Percocet she has taken this in the past use oxycodone  but is since run out she only had of 5 tablets prescribed by [facility] per the patient.  Tolerated this well in the past.  Discussed very careful return precautions with the patient who is in agreement  Return precautions and treatment recommendations and follow-up discussed with the patient who is agreeable with the plan.          FINAL CLINICAL IMPRESSION(S) / ED DIAGNOSES   Final diagnoses:  Kidney stone on left side     Rx / DC Orders   ED Discharge Orders          Ordered    Ambulatory referral to Urology        04/24/24 0051    oxyCODONE -acetaminophen  (PERCOCET/ROXICET) 5-325 MG tablet  Every 6 hours PRN        04/24/24 0053    ondansetron  (ZOFRAN -ODT) 4 MG disintegrating tablet  Every 6 hours PRN        04/24/24 0053             Note:  This document was prepared using Dragon voice recognition software and may include unintentional dictation errors.   Iver Marker, MD 04/24/24 [phone]

## 2024-04-23 NOTE — ED Triage Notes (Addendum)
 Pt in with sharp L flank pain that radiates to LLQ, began 1hr ago. +Nausea and chills. Pt also had recent surgery 4/9 for perforated gastric ulcer

## 2024-04-24 MED ORDER — ONDANSETRON 4 MG PO TBDP: 4.0000 mg | ORAL_TABLET | Freq: Four times a day (QID) | ORAL | 0 refills | Status: AC | PRN

## 2024-04-24 MED ORDER — OXYCODONE-ACETAMINOPHEN 5-325 MG PO TABS: 1.0000 | ORAL_TABLET | Freq: Four times a day (QID) | ORAL | 0 refills | Status: DC | PRN

## 2024-04-24 NOTE — Discharge Instructions (Addendum)
 No driving today or within 8 hours of use of oxycodone .  You have been seen in the Emergency Department (ED) today for pain that we believe based on your workup, is caused by kidney stones.  As we have discussed, please drink plenty of fluids.  Please make a follow up appointment with the physician(s) listed elsewhere in this documentation.   Please see your doctor as soon as possible as stones may take 1-3 weeks to pass and you may require additional care or medications.   Return to the Emergency Department (ED) or call your doctor if you have any worsening pain, fever, painful urination, are unable to urinate, or develop other symptoms that concern you.

## 2024-08-11 ENCOUNTER — Emergency Department
Admission: EM | Admit: 2024-08-11 | Discharge: 2024-08-11 | Disposition: A | Discharge status: Home / Self Care | Attending: Emergency Medicine | Admitting: Emergency Medicine

## 2024-08-11 ENCOUNTER — Other Ambulatory Visit: Payer: Self-pay

## 2024-08-11 ENCOUNTER — Emergency Department

## 2024-08-11 DIAGNOSIS — N132 Hydronephrosis with renal and ureteral calculous obstruction: Secondary | ICD-10-CM | POA: Diagnosis not present

## 2024-08-11 DIAGNOSIS — R109 Unspecified abdominal pain: Secondary | ICD-10-CM | POA: Diagnosis present

## 2024-08-11 DIAGNOSIS — N200 Calculus of kidney: Secondary | ICD-10-CM

## 2024-08-11 LAB — COMPREHENSIVE METABOLIC PANEL WITH GFR
ALT: 38 U/L (ref 0–44)
AST: 29 U/L (ref 15–41)
Albumin: 4.1 g/dL (ref 3.5–5.0)
Alkaline Phosphatase: 67 U/L (ref 38–126)
Anion gap: 8 (ref 5–15)
BUN: 20 mg/dL (ref 6–20)
CO2: 23 mmol/L (ref 22–32)
Calcium: 8.9 mg/dL (ref 8.9–10.3)
Chloride: 109 mmol/L (ref 98–111)
Creatinine, Ser: 0.71 mg/dL (ref 0.44–1.00)
GFR, Estimated: >60 mL/min (ref >60)
Glucose, Bld: 95 mg/dL (ref 70–99)
Potassium: 3.7 mmol/L (ref 3.5–5.1)
Sodium: 140 mmol/L (ref 135–145)
Total Bilirubin: 0.6 mg/dL (ref 0.0–1.2)
Total Protein: 6.9 g/dL (ref 6.5–8.1)

## 2024-08-11 LAB — URINALYSIS, ROUTINE W REFLEX MICROSCOPIC
Bilirubin Urine: NEGATIVE
Glucose, UA: NEGATIVE mg/dL
Ketones, ur: NEGATIVE mg/dL
Leukocytes,Ua: NEGATIVE
Nitrite: NEGATIVE
Protein, ur: 30 mg/dL — AB
RBC / HPF: >50 RBC/hpf (ref 0–5)
Specific Gravity, Urine: 1.027 (ref 1.005–1.030)
Squamous Epithelial / HPF: 0 /HPF (ref 0–5)
pH: 5 (ref 5.0–8.0)

## 2024-08-11 LAB — CBC
HCT: 42.5 % (ref 36.0–46.0)
Hemoglobin: 14.3 g/dL (ref 12.0–15.0)
MCH: 28.8 pg (ref 26.0–34.0)
MCHC: 33.6 g/dL (ref 30.0–36.0)
MCV: 85.7 fL (ref 80.0–100.0)
Platelets: 290 K/uL (ref 150–400)
RBC: 4.96 MIL/uL (ref 3.87–5.11)
RDW: 13.2 % (ref 11.5–15.5)
WBC: 6 K/uL (ref 4.0–10.5)
nRBC: 0 % (ref 0.0–0.2)

## 2024-08-11 MED ORDER — MORPHINE SULFATE (PF) 4 MG/ML IV SOLN
6.0000 mg | Freq: Once | INTRAVENOUS | Status: AC
  Administered 2024-08-11 (×2): 6 mg via INTRAVENOUS
  Filled 2024-08-11: qty 2

## 2024-08-11 MED ORDER — ONDANSETRON HCL 4 MG/2ML IJ SOLN
4.0000 mg | Freq: Once | INTRAMUSCULAR | Status: AC
  Administered 2024-08-11 (×2): 4 mg via INTRAVENOUS
  Filled 2024-08-11: qty 2

## 2024-08-11 MED ORDER — SODIUM CHLORIDE 0.9 % IV BOLUS
1000.0000 mL | Freq: Once | INTRAVENOUS | Status: AC
  Administered 2024-08-11 (×2): 1000 mL via INTRAVENOUS

## 2024-08-11 MED ORDER — MORPHINE SULFATE (PF) 4 MG/ML IV SOLN
4.0000 mg | Freq: Once | INTRAVENOUS | Status: AC
  Administered 2024-08-11 (×2): 4 mg via INTRAVENOUS
  Filled 2024-08-11: qty 1

## 2024-08-11 NOTE — Group Note (Deleted)
 Date:  08/11/2024 Time:  2:22 PM  Group Topic/Focus:  Wellness Toolbox:   The focus of this group is to discuss various aspects of wellness, balancing those aspects and exploring ways to increase the ability to experience wellness.  Patients will create a wellness toolbox for use upon discharge.     Participation Level:  {BHH PARTICIPATION OZCZO:77735}  Participation Quality:  {BHH PARTICIPATION QUALITY:22265}  Affect:  {BHH AFFECT:22266}  Cognitive:  {BHH COGNITIVE:22267}  Insight: {BHH Insight2:20797}  Engagement in Group:  {BHH ENGAGEMENT IN HMNLE:77731}  Modes of Intervention:  {BHH MODES OF INTERVENTION:22269}  Additional Comments:  ***  Myra Curtistine BROCKS 08/11/2024, 2:22 PM

## 2024-08-11 NOTE — ED Notes (Signed)
 Patient discharged before this RN assumed care, patient not in room, previous staff stated that patient has already left.  Patient discharged out of system.

## 2024-08-11 NOTE — ED Triage Notes (Signed)
 Patient states left lower back pain that radiates into left flank; was diagnosed with kidney stone 2 weeks ago at [facility].

## 2024-08-11 NOTE — ED Provider Notes (Signed)
 [facility] Provider Note    Event Date/Time   First MD Initiated Contact with Patient 08/11/24 1427     (approximate)   History   Flank Pain   HPI  Nichole Fuller is a 49 y.o. female past medical history significant for kidney stone, who presents to the emergency department with flank pain.  Patient with past medical history significant for kidney stone and is followed with [facility] urology.  States that she was diagnosed with kidney stone and did have a ureteral stent in place that has since been removed.  Has been having ongoing problems with kidney stone to her left side and states that she is waiting to have a referral to [facility] urology.  States today the pain significantly worsened all of a sudden at lunchtime with severe left-sided flank pain associated with nausea and vomiting.  Denies any dysuria, fever or chills.  Ongoing hematuria that has been present since her diagnosis of kidney stone     Physical Exam   Triage Vital Signs: ED Triage Vitals [08/11/24 1337]  Encounter Vitals Group     BP (!) 124/91     Girls Systolic BP Percentile      Girls Diastolic BP Percentile      Boys Systolic BP Percentile      Boys Diastolic BP Percentile      Pulse Rate 99     Resp 18     Temp 98.1 F (36.7 C)     Temp Source Oral     SpO2 99 %     Weight 158 lb (71.7 kg)     Height 5' 1 (1.549 m)     Head Circumference      Peak Flow      Pain Score 9     Pain Loc      Pain Education      Exclude from Growth Chart     Most recent vital signs: Vitals:   08/11/24 1556 08/11/24 1806  BP:  117/79  Pulse:  82  Resp:  18  Temp:  97.9 F (36.6 C)  SpO2: 99% 100%    Physical Exam Constitutional:      General: She is in acute distress.     Appearance: She is well-developed.  HENT:     Head: Atraumatic.  Eyes:     Conjunctiva/sclera: Conjunctivae normal.  Cardiovascular:     Rate and Rhythm: Regular rhythm.  Pulmonary:     Effort: No respiratory  distress.  Abdominal:     General: There is no distension.     Tenderness: There is left CVA tenderness. There is no guarding or rebound.  Musculoskeletal:        General: Normal range of motion.     Cervical back: Normal range of motion.  Skin:    General: Skin is warm.     Capillary Refill: Capillary refill takes less than 2 seconds.  Neurological:     Mental Status: She is alert. Mental status is at baseline.     IMPRESSION / MDM / ASSESSMENT AND PLAN / ED COURSE  I reviewed the triage vital signs and the nursing notes.  Differential diagnosis including kidney stone, pyelonephritis, diverticulitis, AAA less likely given ongoing symptoms of left-sided kidney stone  On chart review was followed at [facility] urology   RADIOLOGY I independently reviewed imaging, my interpretation of imaging: CT scan with left-sided kidney stone.  Read as 2 mm obstructing renal calculus to the distal  left ureter.  9 mm nonobstructing calculus to the left renal pelvis.  Prior gastric bypass.  LABS (all labs ordered are listed, but only abnormal results are displayed) Labs interpreted as -    Labs Reviewed  URINALYSIS, ROUTINE W REFLEX MICROSCOPIC - Abnormal; Notable for the following components:      Result Value   Color, Urine YELLOW (*)    APPearance HAZY (*)    Hgb urine dipstick LARGE (*)    Protein, ur 30 (*)    Bacteria, UA RARE (*)    All other components within normal limits  CBC  COMPREHENSIVE METABOLIC PANEL WITH GFR     MDM    Patient given 2 doses of IV morphine  and IV fluids with antiemetics  On reevaluation states she is feeling much better but having ongoing mild pain to the left side.  Patient without findings concerning for an infected kidney stone.  Does have a couple WBCs but negative nitrite leukocyte esterase and rare bacteria.  Creatinine appears to be at her baseline.  Patient is already prescribed opioid pain medication, Flomax  and has Zofran  at home.  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  Patient states that her pain is improved since she arrived to the emergency department.  Given information to follow-up as an outpatient with urology.  Discussed return precautions for worsening pain or signs of an infection.  No questions at time of discharge.   PROCEDURES:  Critical Care performed: No  Procedures  Patient's presentation is most consistent with acute presentation with potential threat to life or bodily function.   MEDICATIONS ORDERED IN ED: Medications  morphine  (PF) 4 MG/ML injection 6 mg (6 mg Intravenous Given 08/11/24 1602)  sodium chloride  0.9 % bolus 1,000 mL (0 mLs Intravenous Stopped 08/11/24 1733)  ondansetron  (ZOFRAN ) injection 4 mg (4 mg Intravenous Given 08/11/24 1600)  morphine  (PF) 4 MG/ML injection 6 mg (6 mg Intravenous Given 08/11/24 1731)  morphine  (PF) 4 MG/ML injection 4 mg (4 mg Intravenous Given 08/11/24 1843)    FINAL CLINICAL IMPRESSION(S) / ED DIAGNOSES   Final diagnoses:  Flank pain  Kidney stone     Rx / DC Orders   ED Discharge Orders     None        Note:  This document was prepared using Dragon voice recognition software and may include unintentional dictation errors.   Suzanne Kirsch, MD 08/11/24 2001

## 2024-08-11 NOTE — Discharge Instructions (Signed)
 You are seen in the emergency department for left-sided abdominal pain.  You had a 2 mm kidney stone that is at the end of your ureter near your bladder.  You did not have any findings of an infection.  Follow-up with urology.  Pain medication as you have at home.  You could add on Tylenol  to help with your pain.  Continue with your Flomax.  Stay hydrated and drink plenty of fluids.  Pain control:  Acetaminophen  (tylenol ) - You can take 2 extra strength tablets (1000 mg) every 6 hours as needed for pain/fever.  Do not exceed 4 g of Tylenol  in 24 hours Make sure you eat food/drink water when taking these medications.

## 2024-08-11 NOTE — ED Provider Triage Note (Signed)
 Emergency Medicine Provider Triage Evaluation Note  Nichole Fuller , a 48 y.o. female  was evaluated in triage.  Pt complains of left side flank pain ongoing x 2 weeks but worse today. CT at [facility] 2 weeks ago showed kidney stone. Referral for [facility] urology, but no appointment yet.  Physical Exam  There were no vitals taken for this visit. Gen:   Awake, no distress   Resp:  Normal effort  MSK:   Moves extremities without difficulty  Other:    Medical Decision Making  Medically screening exam initiated at 1:36 PM.  Appropriate orders placed.  Nichole Fuller was informed that the remainder of the evaluation will be completed by another provider, this initial triage assessment does not replace that evaluation, and the importance of remaining in the ED until their evaluation is complete.    Nichole Kirk NOVAK, FNP 08/11/24 1346

## 2024-08-25 ENCOUNTER — Ambulatory Visit: Admission: EM | Admit: 2024-08-25 | Discharge: 2024-08-25 | Disposition: A | Discharge status: Home / Self Care

## 2024-08-25 ENCOUNTER — Ambulatory Visit (INDEPENDENT_AMBULATORY_CARE_PROVIDER_SITE_OTHER)

## 2024-08-25 DIAGNOSIS — M25521 Pain in right elbow: Secondary | ICD-10-CM | POA: Diagnosis not present

## 2024-08-25 DIAGNOSIS — M7711 Lateral epicondylitis, right elbow: Secondary | ICD-10-CM | POA: Diagnosis not present

## 2024-08-25 MED ORDER — METHYLPREDNISOLONE 4 MG PO TBPK: ORAL_TABLET | ORAL | 0 refills | Status: DC

## 2024-08-25 NOTE — ED Provider Notes (Signed)
 [facility]    CSN: [id number] Arrival date & time: 08/25/24  1816      History   Chief Complaint Chief Complaint  Patient presents with   Joint Swelling    HPI Nichole Fuller is a 48 y.o. female.   HPI  48 year old female past medical history significant for rheumatoid arthritis, kidney stones, hypothyroidism, and migraine headaches presents for evaluation of right elbow pain.  She reports that approximately 1 week ago she fell down some stairs but does not remember hitting her elbow.  2 days ago, while at the gym, she landed awkwardly doing box jumps and again fell but does not remember striking her elbow.  Since that second fall she has noticed pain in the right elbow and then today she noticed swelling.  She has numbness and tingling in her hands at baseline but has not noticed any increase.  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      Home Medications    Prior to Admission medications   Medication Sig Start Date End Date Taking? Authorizing Provider  atorvastatin (LIPITOR) 40 MG tablet Take 40 mg by mouth daily. 07/14/23 07/06/25 Yes [provider]  Biotin 89999 MCG TBDP Take 10,000 mcg by mouth daily.   Yes [provider]  busPIRone (BUSPAR) 10 MG tablet Take 10 mg by mouth 2 (two) times daily.   Yes [provider]  Calcium Carb-Cholecalciferol (CALCIUM 1000 + D) 1000-20 MG-MCG TABS Take 1 tablet by mouth at bedtime.   Yes  [provider]  Cholecalciferol 75 MCG (3000 UT) TABS Take 75 mcg by mouth daily. 09/16/22  Yes [provider]  estradiol (ESTRACE) 1 MG tablet  09/07/19  Yes [provider]  FERROUS SULFATE PO Take 65 mg by mouth daily.   Yes [provider]  leflunomide (ARAVA) 10 MG tablet Take 10 mg by mouth daily.   Yes [provider]  levothyroxine  (SYNTHROID ) 125 MCG tablet Take 125 mcg by mouth daily before breakfast.   Yes [provider]  LINZESS 72 MCG capsule Take 72 mcg by mouth every morning.   Yes [provider]  methylPREDNISolone  (MEDROL  DOSEPAK) 4 MG TBPK tablet Take according to the package insert. 08/25/24  Yes Bernardino Ditch, NP  montelukast (SINGULAIR) 10 MG tablet Take 10 mg by mouth at bedtime. 03/11/19  Yes [provider]  Multiple Vitamins-Minerals (BARIATRIC MULTIVITAMINS/IRON PO) Take 1 tablet by mouth daily.   Yes [provider]  pregabalin (LYRICA) 25 MG capsule Take 25 mg by mouth as directed. Take 25mg  in the morning and 75mg  at night. 03/17/23  Yes [provider]  propranolol (INDERAL) 40 MG tablet Take by mouth. 09/07/19  Yes [provider]  cetirizine  (ZYRTEC  ALLERGY) 10 MG tablet Take 1 tablet (10 mg total) by mouth 2 (two) times daily. 03/28/24 04/27/24  Cyrena Mylar, MD  lidocaine  (  XYLOCAINE ) 5 % ointment Apply 1 Application topically daily. To right thigh. 06/11/23   [provider]  ondansetron  (ZOFRAN -ODT) 4 MG disintegrating tablet Take 1 tablet (4 mg total) by mouth every 6 (six) hours as needed for nausea or vomiting. 04/24/24   Dicky Anes, MD  oxyCODONE -acetaminophen  (PERCOCET/ROXICET) 5-325 MG tablet Take 1 tablet by mouth every 6 (six) hours as needed for severe pain (pain score 7-10). 04/24/24   Dicky Anes, MD  phentermine (ADIPEX-P) 37.5 MG tablet Take 1 tablet by mouth daily before breakfast. 05/14/23   [provider]  topiramate (TOPAMAX) 25 MG tablet Take  25 mg by mouth daily. 05/14/23 05/13/24  [provider]  XELJANZ XR 11 MG TB24 Take 11 mg by mouth daily. 09/02/19   [provider]  Zinc Sulfate (ZINC 15 PO) Take 1 tablet by mouth daily.    [provider]    Family History History reviewed. No pertinent family history.  Social History Social History   Tobacco Use   Smoking status: Never   Smokeless tobacco: Never  Substance Use Topics   Alcohol use: No   Drug use: No     Allergies   Bupivacaine liposome, Bupropion, Latex, Lisinopril, Nsaids, Semaglutide, Prednisone , Promethazine, and Sulfa antibiotics   Review of Systems Review of Systems  Musculoskeletal:  Positive for arthralgias and joint swelling.  Skin:  Negative for color change.  Neurological:  Positive for numbness. Negative for weakness.     Physical Exam Triage Vital Signs ED Triage Vitals  Encounter Vitals Group     BP      Girls Systolic BP Percentile      Girls Diastolic BP Percentile      Boys Systolic BP Percentile      Boys Diastolic BP Percentile      Pulse      Resp      Temp      Temp src      SpO2      Weight      Height      Head Circumference      Peak Flow      Pain Score      Pain Loc      Pain Education      Exclude from Growth Chart    No data found.  Updated Vital Signs BP (!) 126/90 (BP Location: Left Arm)   Pulse 69   Temp 98.5 F (36.9 C) (Oral)   Resp 19   Wt 158 lb (71.7 kg)   SpO2 100%   BMI 29.85 kg/m   Visual Acuity Right Eye Distance:   Left Eye Distance:   Bilateral Distance:    Right Eye Near:   Left Eye Near:    Bilateral Near:     Physical Exam Vitals and nursing note reviewed.  Constitutional:      Appearance: Normal appearance. She is not ill-appearing.  HENT:     Head: Normocephalic and atraumatic.  Musculoskeletal:        General: Tenderness and signs of injury present. No swelling.  Skin:    General: Skin is warm and dry.     Capillary Refill: Capillary  refill takes less than 2 seconds.     Findings: Bruising present. No erythema.  Neurological:     General: No focal deficit present.     Mental Status: She is alert and oriented to person, place, and time.      UC Treatments / Results  Labs (all  labs ordered are listed, but only abnormal results are displayed) Labs Reviewed - No data to display  EKG   Radiology No results found.  Procedures Procedures (including critical care time)  Medications Ordered in UC Medications - No data to display  Initial Impression / Assessment and Plan / UC Course  I have reviewed the triage vital signs and the nursing notes.  Pertinent labs & imaging results that were available during my care of the patient were reviewed by me and considered in my medical decision making (see chart for details).   Patient is a pleasant, nontoxic-appearing 48 year old female presenting for evaluation of right elbow pain as outlined in the HPI above.  In the exam room the patient is holding her right arm and elbow close to her body, but in a relaxed state.  She has full range of motion of the elbow though range of motion does cause pain.  She is tender with palpation over the lateral epicondyle as well as over the olecranon process.  No tenderness with palpation of the medial condyle of the elbow.  I suspect most likely the patient is experiencing tennis elbow, however, given that she has suffered 2 falls I will obtain a radiograph to evaluate for any bony abnormality.  I do not appreciate any erythema, ecchymosis, or edema of the elbow.  She does have a small bruise in the proximal and lateral forearm that is from a previous IV site.  Right elbow films independently reviewed and evaluated by me.  Impression: No evidence of fracture or dislocation.  Soft tissues are unremarkable.  Radiology overread is pending.  I will discharge patient on the diagnosis of lateral epicondylitis.  Given that she has had a gastric bypass  I will start her on a Medrol  Dosepak to start tomorrow morning.  Also give her home physical therapy exercises to perform.   Final Clinical Impressions(s) / UC Diagnoses   Final diagnoses:  Right elbow pain  Lateral epicondylitis of right elbow     Discharge Instructions      Your x-rays do not show any evidence of broken bones.  Your exam is consistent with lateral epicondylitis, also known as tennis elbow.  Starting tomorrow morning begin taking the Medrol  Dosepak to help decrease inflammation and pain.  You may apply ice to your elbow for 20-minute at a time, 2-3 times a day, to help with pain and inflammation.  I have also included some physical therapy exercises for you to perform.  If your symptoms do not improve in a week to 10 days I would follow-up with orthopedics such as [facility] here in [place] or in [place].     ED Prescriptions     Medication Sig Dispense Auth. Provider   methylPREDNISolone  (MEDROL  DOSEPAK) 4 MG TBPK tablet Take according to the package insert. 1 each Bernardino Ditch, NP      PDMP not reviewed this encounter.   Bernardino Ditch, NP 08/25/24 1901

## 2024-08-25 NOTE — ED Triage Notes (Signed)
 Patient states that she fell about a week ago and Monday. Unsure if she landed on the elbow. Right elbow. Patient stats that she noticed she was having pain about 3 days ago and swelling today.

## 2024-08-25 NOTE — Discharge Instructions (Addendum)
 Your x-rays do not show any evidence of broken bones.  Your exam is consistent with lateral epicondylitis, also known as tennis elbow.  Starting tomorrow morning begin taking the Medrol  Dosepak to help decrease inflammation and pain.  You may apply ice to your elbow for 20-minute at a time, 2-3 times a day, to help with pain and inflammation.  I have also included some physical therapy exercises for you to perform.  If your symptoms do not improve in a week to 10 days I would follow-up with orthopedics such as [facility] here in [place] or in [place].

## 2024-09-07 NOTE — H&P (View-Only) (Signed)
 PASS-Perioperative Anesthesia & Surgical Screening  Clinic interview ALERTS:       Procedure:   Date/Time: 09/21/2024   Procedure: Procedure(s): Cystoscopy, Ureteroscopy, Laser Lithotripsy, Ureteral Stent Placement UROGRAPHY, RETROGRADE, WITH OR WITHOUT KUB   Location: [facility] OR  Surgeon(s): Antonina Charleston, MD      CC/HPI  Nichole Fuller is a 48 y.o. female who presents to PASS clinic for preoperative evaluation and risk stratification.  Patinet has recurrent nephrolithiasis with 2 mm left distal ureteral stone and a 9 mm renal pelvis stone.  Patient is scheduled for left ureteroscopy for stone removal on 09/21/24.    Vitals (36hrs): Temp:  [36.3 C (97.3 F)] 36.3 C (97.3 F) Heart Rate:  [81] 81 Resp:  [16] 16 BP: (111)/(72) 111/72 SpO2:  [100 %] 100 % Height:  [154.9 cm (5' 1)] 154.9 cm (5' 1) Weight:  [72.8 kg (160 lb 7.9 oz)] 72.8 kg (160 lb 7.9 oz) BMI (Calculated):  [30.34] 30.34 Relevant Risk Scores: STOP BANG: 0 [facility] Activity Status Index (DASI): 58.2  Medical History                                    HEENT Normal as reviewed  Pulmonary .  Asthma: controlled . Negative for URI Exercise-induced asthma    Cardiovascular  .  Exercise tolerance: >4 METS (patient participates in boot camp classes 5 days per week)   Hyperlipidemia - on statin Gastrointestinal .  GERD:, well controlled, with medication .  Gastric Surgery (2022): Gastric Bypass  GU/Renal .  Nephrolithiasis  Recurrent nephrolithiasis with 2 mm left distal ureteral stone and a 9 mm renal pelvis stone Recurrent stones likely exacerbated by previous gastric bypass surgery  Scheduled left URS for stone removal 09/21/24      Neuro/Spine/CNS Migraines  Rheumatology .  Rheumatoid arthritis  Endocrine .  Obesity: .  Thyroid disorder: Hypothyroidism   Pain .  Chronic pain: Taking oxycodone  5 mg nightly Skin  Normal as reviewed. GYN .  Hysterectomy  No LMP recorded.  Patient has had a hysterectomy. Infectious Disease Normal as reviewed. Psych History of pseudoseizure    Review of Systems   A comprehensive 10-system ROS was asked and was negative except for the following:  Cardiac: no chest pressure, no cardiac dyspnea, no irregular heartbeat  Past Surgical History   Past Surgical History:  Procedure Laterality Date  . APPENDECTOMY    . GASTRIC BYPASS OPEN    . HYSTERECTOMY      Social/Family History   Social History   Occupational History  . Not on file  Tobacco Use  . Smoking status: Never  . Smokeless tobacco: Never  Vaping Use  . Vaping status: Never Used  Substance and Sexual Activity  . Alcohol use: Never  . Drug use: Never  . Sexual activity: Defer   Vaping/E-Cigarettes  . Vaping/E-Cigarette Use Never User   . Start Date    . Cartridges/Day    . Quit Date     History reviewed. No pertinent family history. Allergies   Allergies  Allergen Reactions  . Bupivacaine Liposome Shortness Of Breath  . Bupivacaine Liposome (Pf) Shortness Of Breath  . Bupropion Hives  . Latex Hives, Itching, Rash and Swelling  . Lisinopril Other (See Comments) and Swelling  . Nsaids (Non-Steroidal Anti-Inflammatory Drug) Other (See Comments)    Cannot have after gastric bypass  . Semaglutide Nausea And Vomiting  .  Prednisone  Other (See Comments)    CAUTION PT HAD RNY GBP  . Promethazine Other (See Comments)    Muscle spasms   . Sulfa (Sulfonamide Antibiotics) Rash   Medications   Current Outpatient Medications  Medication  . multivit-min/iron/folic acid/K (BARIATRIC MULTIVITAMINS ORAL)  . ciprofloxacin HCl (CIPRO) 500 MG tablet  . oxyCODONE  (ROXICODONE ) 5 MG immediate release tablet  . atorvastatin  (LIPITOR) 40 MG tablet  . ferrous sulfate 325 (65 FE) MG EC tablet  . leflunomide (ARAVA) 10 MG tablet  . loratadine  (CLARITIN ) 10 mg tablet  . methocarbamoL  (ROBAXIN ) 500 MG tablet  . pantoprazole  (PROTONIX ) 40 MG DR tablet  .  topiramate (TOPAMAX) 25 MG tablet  . pregabalin  (LYRICA ) 25 MG capsule  . phentermine (ADIPEX-P) 37.5 mg tablet  . albuterol  90 mcg/actuation inhaler  . traZODone (DESYREL) 50 MG tablet  . busPIRone  (BUSPAR ) 10 MG tablet  . levothyroxine  (SYNTHROID ) 125 MCG tablet  . montelukast  (SINGULAIR ) 10 mg tablet  . XELJANZ XR 11 mg extended release tablet   No current facility-administered medications for this visit.   Physical Exam  Airway/HEENT/Dental: Mallampati score: II. Thyromental distance is >3 FB finger breadths. Mouth opening: Normal. Neck ROM is Full.  Cardiovascular: Regular rate and rhythm. Normal heart sounds.  Pulmonary: Breath Sounds Clear. Respiratory effort: Normal.   Skin: Skin intact, warm, and dry.   Musculoskeletal: Moves all extremities. Functional assessment: independent. Lower extremity edema not present.  Neuro: Oriented to person, place and time.   Constitutional: Well-developed, well-nourished, no distress.   Test Results    No results found for: WBC, HGB, HCT, PLT, CHOL, TRIG, HDL, LDLDIRECT, ALT, AST, GLUCOSE, NA, K, CL, CALCIUM , MG, CREATININE, BUN, CO2, ALB, TSH, PSA, HGBA1C, INR, PT, APTT, VITD           Patient Instructions  Medication instructions prior to procedure:    Medication Sig INSTRUCTIONS  . albuterol  90 mcg/actuation inhaler Inhale 2 inhalations into the lungs every 4 (four) hours as needed TAKE day of procedure, if needed  . atorvastatin  (LIPITOR) 40 MG tablet Take 40 mg by mouth once daily TAKE day of procedure  . busPIRone  (BUSPAR ) 10 MG tablet Take 10 mg by mouth 2 (two) times daily TAKE day of procedure  . ciprofloxacin HCl (CIPRO) 500 MG tablet Take 1 tablet (500 mg total) by mouth 2 (two) times daily for 7 days Complete as prescribed.  Ok to take day of procedure  . ferrous sulfate 325 (65 FE) MG EC tablet Take 65 mg by mouth daily with breakfast DO NOT TAKE day of procedure  .  leflunomide (ARAVA) 10 MG tablet Take 10 mg by mouth once daily TAKE day of procedure  . levothyroxine  (SYNTHROID ) 125 MCG tablet  TAKE day of procedure  . loratadine  (CLARITIN ) 10 mg tablet Take 10 mg by mouth once daily TAKE day of procedure, if needed  . methocarbamoL  (ROBAXIN ) 500 MG tablet  TAKE night before procedure if needed.  . montelukast  (SINGULAIR ) 10 mg tablet Take by mouth once daily TAKE day of procedure  . multivit-min/iron/folic acid/K (BARIATRIC MULTIVITAMINS ORAL) Take by mouth STOP taking 7 days prior to procedure  . oxyCODONE  (ROXICODONE ) 5 MG immediate release tablet Take 1 tablet (5 mg total) by mouth every 6 (six) hours as needed for Pain for up to 5 days TAKE day of procedure, if needed  . pantoprazole  (PROTONIX ) 40 MG DR tablet Take 40 mg by mouth once daily TAKE day of procedure  . phentermine (ADIPEX-P) 37.5  mg tablet Take 37.5 mg by mouth every morning before breakfast STOP taking 7 days prior to procedure  . pregabalin  (LYRICA ) 25 MG capsule 25 mg in the morning and 50 mg at night TAKE night before procedure  . topiramate (TOPAMAX) 25 MG tablet Take 1 tablet by mouth once daily TAKE day of procedure  . traZODone (DESYREL) 50 MG tablet  TAKE night before procedure if needed.  . XELJANZ XR 11 mg extended release tablet Take 11 mg by mouth once daily HOLD 3 days prior to procedure.  Last dose on 9/19.  Follow any further instructions per your rheumatologist   Problem List   Patient Active Problem List  Diagnosis  . Acquired hypothyroidism  . Arthralgia of right acromioclavicular joint  . Bilateral sacroiliitis ()  . Chronic right-sided low back pain with right-sided sciatica  . Meralgia paresthetica of right side  . Migraine without aura and with status migrainosus, not intractable  . Rheumatoid arthritis of multiple sites with negative rheumatoid factor (CMS/HHS-HCC)  . Pseudoseizures  . BMI 50.0-59.9, adult (CMS/HHS-HCC)  . H/O: hysterectomy  . Paranoid  schizophrenia (CMS/HHS-HCC)  . Vasovagal syncope  . Preoperative evaluation to rule out surgical contraindication  . Nephrolithiasis   Assessment & Plan  ASA: 3 Anesthesia options discussed: General  Diagnoses and all orders for this visit:  Preoperative evaluation to rule out surgical contraindication  Nephrolithiasis - Recurrent nephrolithiasis related to gastric bypass status.  Patient is scheduled for the above procedure.  Rheumatoid arthritis of multiple sites with negative rheumatoid factor (CMS/HHS-HCC) -Continues Xeljanz, Arava; stable per patient report and review of record.  Followed by specialist.  Continue follow-up as recommended.     Anesthesia considerations: No prior anesthesia problems   Perioperative medical risk as follows:  Cardiovascular risk History of hyperlipidemia, on statin.  No cardiac history or symptoms.  DASI 58.2.  No further cardiac testing or optimization indicated at this time.  Pulmonary risk History of exercise-induced asthma.  Per ARISCAT Score for Postoperative Pulmonary Complications, patient is at low risk of in-hospital post-op pulmonary complications (composite including respiratory failure, respiratory infection, pleural effusion, atelectasis, pneumothorax, bronchospasm, and aspiration pneumonitis).  Anticoagulation/Antiplatelet She is not on anticoagulation.    Appropriateness of surgery location [facility] appropriate   Overall Assessment Further medical assessment or optimization is not required prior to proceeding with procedure. The patient has no outstanding questions or concerns.  Patient has been evaluated for anesthesia risk prior to procedure. Medical records, medications, labs and pertinent testing reviewed.  Instructions for day of procedure and brochure given to patient including number to call for arrival time, importance of adhering to NPO guidelines, patient verification, pain management, bathing instructions,  possible site markings and use of antibiotics, and reducing alcohol and tobacco products if applicable. Patient verbalized understanding of instructions.   Chiquita Sers, PA-C [facility] 09/09/2024    Day of Surgery  Day of Surgery Exam

## 2024-09-12 NOTE — Progress Notes (Addendum)
 [facility] Office Visit Note:  Assessment/Plan: 48 y.o. female with a past medical history significant for recent ureterolithiasis, marginal ulcer with perforation S/P laparoscopic washout/oversew/omental patch (04/07/2024), type II diabetes mellitus, migraine without aura, hypothyroidism, rheumatoid arthritis, bilateral sacroiliitis, right-sided sciatica, PNES, NAFLD, history of gastric bypass, depression/anxiety, and paranoid schizophrenia who presents for an established patient appointment for evaluation of fever blisters.  1.  Fever blisters: - Vitals reassuring, and physical examination as-documented below. Patient mentions that symptoms have been refractory to topical Abreva use. Valtrex 1 gram PO qdaily x14 days ultimately prescribed following shared-decision conversation with patient. Asked patient to gauge her response and to alert me should her symptoms persist, evolve, or worsen despite treatment.  2.  Hematuria, left-sided flank pain, back pain, and left hip pain: - Recent urology office visit documentation on-file 08/26/2024 reviewed. Patient is currently-scheduled to undergo surgery later this month (09/21/2024). Will continue to monitor. Asked patient to reach-out to me at any point with any additional questions/concerns.  3.  Type II diabetes mellitus: - Point-of-care A1c = 5.1% in-clinic today. No additional medication changes/new recommendations made at this time following shared-decision conversation with patient. Will continue to monitor. Asked patient to return within 3 months for re-evaluation or at any point sooner as-needed.  Subjective: Chief complaint:  Established patient appointment for evaluation of fever blisters.  HPI: Nichole Fuller is a 48 y.o. female with a past medical history significant for recent ureterolithiasis, marginal ulcer with perforation S/P laparoscopic washout/oversew/omental patch (04/07/2024), type II diabetes mellitus, migraine  without aura, hypothyroidism, rheumatoid arthritis, bilateral sacroiliitis, right-sided sciatica, PNES, NAFLD, history of gastric bypass, depression/anxiety, and paranoid schizophrenia who presents for an established patient appointment for evaluation of fever blisters. She reports that, for a recent unspecified timeframe, she has experienced recurrent fever blisters on her lips and within her mouth. She says that she has tried using Abreva, which she had on-hand at home, for these, but she says that they have persisted and worsened. She denies any additional new complaints/concerns at this time, although she mentioned that she continues to experience flank pain and hematuria in the setting of her known ureterolithiasis, for which she is currently-scheduled to surgery for later this month.  ROS: Review of systems reviewed with patient and negative excluding as-above.   Past Medical History: Past Medical History:  Diagnosis Date  . Abnormal Pap smear of cervix 2008  . Allergic   . Anxiety 2021  . Asthma (HHS-HCC)   . BMI 45.0-49.9, adult (CMS-HCC)   . Chronic kidney disease   . Depression   . Disorder of skin or subcutaneous tissue   . Fibroid 2003  . Headaches- migraines   . Hypothyroid   . Kidney stone 1998  . Migraine   . NAFLD/Liver disease   . Neuromuscular disorder    (CMS-HCC) 2021  . Ovarian cyst- hysterectomy 2005   . Pseudoseizures   . Rheumatoid Arthritis   . Urinary tract infection    Past Surgical History: Past Surgical History:  Procedure Laterality Date  . APPENDECTOMY  04/2017  . BLADDER SUSPENSION  2009  . BREAST BIOPSY Left 10/2016   benign x2  . COLON SURGERY    . COLPOSCOPY    . HYSTERECTOMY    . KIDNEY STONE SURGERY  05/26/2024  . LEFT OOPHORECTOMY  2008   open  . OOPHORECTOMY Bilateral 05/19/2017  . ovarian tor    . ovarian torsion    . PR CYSTO/URETERO W/LITHOTRIPSY &INDWELL STENT INSRT  Left 05/26/2024   Procedure: CYSTOURETHROSCOPY, WITH URETEROSCOPY  AND/OR PYELOSCOPY; WITH LITHOTRIPSY INCLUDING INSERTION OF INDWELLING URETERAL STENT;  Surgeon: Mitch Alm Pickles, MD;  [facility];  Service: Urology  . PR CYSTOSCOPY,INSERT URETERAL STENT Left 05/09/2024   Procedure: CYSTOURETHROSCOPY,  WITH INSERTION OF INDWELLING URETERAL STENT (EG, GIBBONS OR DOUBLE-J TYPE);  Surgeon: Mitch Alm Pickles, MD;  Location: OR [facility];  Service: Urology  . PR FREEING BOWEL ADHESION,ENTEROLYSIS N/A 05/19/2017   Procedure: ENTEROLYSIS (SEPART PROC);  Surgeon: Rocky Werner Maffucci, MD;  Location: [facility] OR [facility];  Service: Advanced Laparoscopy  . PR LAP GASTRIC BYPASS/ROUX-EN-Y N/A 10/23/2022   Procedure: LAPAROSCOPY, SURG, GASTRIC RESTRICT PROC; W/GASTRIC BYPASS & ROUX-EN-Y GASTROENTEROS(ROUX LIMB 150 CM/LESS);  Surgeon: Librada Evalene Sharper, MD;  Location: MAIN OR [facility];  Service: Gastrointestinal  . PR LAP,APPENDECTOMY N/A 05/19/2017   Procedure: LAPAROSCOPY SURGICAL APPENDECTOMY;  Surgeon: Rocky Werner Maffucci, MD;  Location: [facility] OR [facility];  Service: Advanced Laparoscopy  . PR LAP,DIAGNOSTIC ABDOMEN N/A 04/07/2024   Procedure: LAPAROSCOPY DIAGNOSTIC OR OPERATIVE;  Surgeon: Librada Evalene Sharper, MD;  Location: OR [facility];  Service: Gastrointestinal  . PR LAP,RMV  ADNEXAL STRUCTURE Right 05/19/2017   Procedure: LAPAROSCOPY, SURGICAL; W/REMOVAL OF ADNEXAL STRUCTURES (PARTIAL OR TOTAL OOPHORECTOMY &/OR SALPINGECTOMY);  Surgeon: Rocky Werner Maffucci, MD;  Location: [facility] OR [facility];  Service: Advanced Laparoscopy  . PR OMENTAL FLAP,INTRA-ABDOMINAL N/A 04/07/2024   Procedure: OMENTAL FLAP;  Surgeon: Librada Evalene Sharper, MD;  Location: OR [facility];  Service: Gastrointestinal  . PR RELEASE URETER,RETROPER FIBROSIS Right 05/19/2017   Procedure: URETEROLYSIS, WITH OR WITHOUT REPOSITIONING OF URETER FOR RETROPERITONEAL FIBROSIS;  Surgeon: Rocky Werner Maffucci, MD;  Location: [facility] OR [facility];  Service: Advanced Laparoscopy  . PR  UPPER GI ENDOSCOPY,BIOPSY N/A 06/11/2022   Procedure: UGI ENDOSCOPY; WITH BIOPSY, SINGLE OR MULTIPLE;  Surgeon: Prentice Lynwood Slider, MD;  [facility];  Service: Gastroenterology  . PR UPPER GI ENDOSCOPY,BIOPSY Left 04/28/2024   Procedure: UGI ENDOSCOPY; WITH BIOPSY, SINGLE OR MULTIPLE;  Surgeon: Gwenn Dallas Ruth, MD;  Location: GI PROCEDURES [facility];  Service: Gastroenterology  . PR UPPER GI ENDOSCOPY,DIAGNOSIS N/A 10/23/2022   Procedure: UGI ENDO, INCLUDE ESOPHAGUS, STOMACH, & DUODENUM &/OR JEJUNUM; DX W/WO COLLECTION SPECIMN, BY BRUSH OR WASH;  Surgeon: Librada Evalene Sharper, MD;  Location: MAIN OR [facility];  Service: Gastrointestinal  . PR UPPER GI ENDOSCOPY,DIAGNOSIS N/A 04/07/2024   Procedure: UGI ENDO, INCLUDE ESOPHAGUS, STOMACH, & DUODENUM &/OR JEJUNUM; DX W/WO COLLECTION SPECIMN, BY BRUSH OR WASH;  Surgeon: Librada Evalene Sharper, MD;  Location: OR [facility];  Service: Gastrointestinal  . SALPINGECTOMY Bilateral 2009  . SHOULDER ARTHROSCOPY Right 01/07/2015  . SHOULDER ARTHROSCOPY W/ SUBACROMIAL DECOMPRESSION AND DISTAL CLAVICLE EXCISION Right 01/07/2017   *WC*  . TOTAL ABDOMINAL HYSTERECTOMY     Family History: Family History  Problem Relation Age of Onset  . Basal cell carcinoma Mother   . Arthritis Mother        RA  . Diabetes Father   . Hypertension Father   . Rheum arthritis Father   . Arthritis Father   . Breast cancer Sister        Sister Dorrene may have breast cancer recently 40 (vs DCIS or pre-cancer)  . No Known Problems Sister   . No Known Problems Maternal Aunt   . No Known Problems Maternal Uncle   . Breast cancer Paternal Aunt        late 20s/early 30s; died  . Cancer Paternal  Aunt        2x pat aunt cancers NOS in 71s, one aunt lung ca 69s  . No Known Problems Paternal Uncle   . No Known Problems Maternal Grandmother   . Breast cancer Maternal Grandfather        dx 50-60s, died of MI not cancer (70)  . Cancer Maternal  Grandfather        Breast  . Breast cancer Paternal Grandmother        d. breast ca 41s  . Cancer Paternal Grandmother   . No Known Problems Daughter   . Diabetes Other   . Hypertension Other   . Arthritis Other   . Cancer Paternal Aunt        Breast  . Colon cancer Neg Hx   . Endometrial cancer Neg Hx   . Ovarian cancer Neg Hx   . BRCA 1/2 Neg Hx   . Anesthesia problems Neg Hx   . Broken bones Neg Hx   . Clotting disorder Neg Hx   . Collagen disease Neg Hx   . Dislocations Neg Hx   . Fibromyalgia Neg Hx   . Gout Neg Hx   . Hemophilia Neg Hx   . Osteoporosis Neg Hx   . Rheumatologic disease Neg Hx   . Scoliosis Neg Hx   . Severe sprains Neg Hx   . Sickle cell anemia Neg Hx   . Spinal Compression Fracture Neg Hx    Social History: Social History   Tobacco Use  . Smoking status: Never    Passive exposure: Never  . Smokeless tobacco: Never  Vaping Use  . Vaping status: Never Used  Substance Use Topics  . Alcohol use: Not Currently  . Drug use: Never    Allergies: Bupropion, Exparel (pf) [bupivacaine liposome (pf)], Latex, Lisinopril, Nsaids (non-steroidal anti-inflammatory drug), Ozempic [semaglutide], Prednisone , Promethazine, and Sulfa (sulfonamide antibiotics)  Home medications: Current Outpatient Medications on File Prior to Visit  Medication Sig Dispense Refill  . biotin 10,000 mcg TbDL Take 10,000 mcg by mouth in the morning.    . blood-glucose meter kit Use as instructed 1 each 0  . cholecalciferol, vitamin D3-75 mcg, 3,000 unit,, 75 mcg (3,000 unit) Tab Take 1 tablet (75 mcg total) by mouth in the morning. 90 tablet 0  . levothyroxine  (SYNTHROID ) 125 MCG tablet Take 1 tablet (125 mcg total) by mouth daily. 90 tablet 0  . montelukast  (SINGULAIR ) 10 mg tablet Take 1 tablet (10 mg total) by mouth every evening. 90 tablet 2  . pantoprazole  (PROTONIX ) 40 MG tablet Take 1 tablet (40 mg total) by mouth daily. 90 tablet 3  . phentermine (ADIPEX-P) 37.5 mg tablet  Take 1 tablet (37.5 mg total) by mouth daily before breakfast. 30 tablet 1  . pregabalin  (LYRICA ) 25 MG capsule Start 25mg  lyrica  at night for 1 week. Then increase to 50mg  at night for 1 week. Then increase to 25mg  in the morning and 50mg  at night for 1 week. Then increase to 25mg  in the morning, 25mg  in the evening and 50mg  at night. 120 capsule 3  . senna-docusate (PERICOLACE) 8.6-50 mg Take 1 tablet by mouth daily as needed for constipation. 40 tablet 3  . ursodioL (ACTIGALL) 300 mg capsule Take 1 capsule (300 mg total) by mouth two (2) times a day. 60 capsule 5  . XELJANZ XR 11 mg Tb24 TAKE ONE TABLET BY MOUTH ONCE DAILY. MAY BE TAKEN WITH OR WITHOUT FOOD. SWALLOW TABLET WHOLE. DO NOT CRUSH,  SPLIT OR CHEW. STORE AT ROOM TEMPERATURE. 90 tablet 3   Objective: Vitals: Vitals:   09/10/24 1632  BP: 122/88  BP Position: Sitting  BP Cuff Size: Large  Pulse: 86  Resp: 16  Temp: 37.2 C (98.9 F)  TempSrc: Oral  SpO2: 98%  Weight: 69.3 kg (152 lb 12.8 oz)   Body mass index is 28.87 kg/m.  Physical examination: General:  Alert and oriented. No acute distress. Eyes:  Pupils are equal, round, and reactive to light. EOMI. No scleral icterus or conjunctival erythema/injection. HENT:  Normocephalic. Grossly normal hearing. Mucosal membranes moist. Respiratory:  Respirations are non-labored. Symmetrical chest wall expansion. Integumentary:  Warm and dry. Fever blisters present. Neurologic:  Alert and oriented. CNs II-XII grossly intact. Psychiatric:  Cooperative. Mood and affect appropriate.  Ozell Donald, D.O. [facility]

## 2024-09-17 NOTE — Progress Notes (Signed)
 Ciprofloxacin for 3 days preop.  Lamar Alar, MD

## 2024-09-21 NOTE — Procedures (Signed)
 Operative Note   09/21/2024  PRE-OP DIAGNOSIS: Urolithiasis [N20.9]    POST-OP DIAGNOSIS: Post-Op Diagnosis Codes:    * Urolithiasis [N20.9]  Procedure(s): Cystoscopy, Ureteroscopy, Laser Lithotripsy, Ureteral Stent Placement UROGRAPHY, RETROGRADE, WITH OR WITHOUT KUB   SURGEON: Surgeons and Role:    * Antonina Charleston, MD - Primary    * Jackquline Beverley ORN, MD - Resident - Assisting    * Micheline Radford, MD - Fellow  ANESTHESIA: General   OPERATIVE FINDINGS: Radiologic Findings:  1. Retrograde pyelogram was performed on the left side using a 5 Fr open ended catheter.  10 mL of 50% dilute Isovue was injected.  2. The following findings were noted: mild hydronephrosis, filling defect at the UPJ suggestive of a 1 cm stone  3. Fluoroscopy demonstrated no visible stone  4. Cystoscopy demonstrated no bladder tumors or stones  5. Ureteroscopy demonstrated no ureteral stones and a 1 cm UPJ stone with a central core containing organized blood clot.  6. 6 x 24 stent without strings was inserted in the left kidney.  INDICATION FOR PROCEDURE: Patient is a 48 y.o. female with a 2 mm left distal ureteral stone and 1 cm left UPJ stone.  We discussed options for management and she selected to undergo ureteroscopy. I discussed with the patients risks and benefits and alternatives to ureteroscopy with laser lithotripsy.  The risks include bleeding, infection, injury to the urethra, bladder, ureter or kidney, risk of a staged procedure, risk of stricture, risk of residual fragments, risk of loss of kidney, risks of anesthesia including DVT, PE, MI and death.  I also discussed with the patient the possibility of having a ureteral stent placed.  We discussed stent related symptoms and probable duration of stent placement.  The patient was counseled that the stent is temporary and will be removed either cystoscopically in the office or by external string.  The patient understands these risks and wishes to  proceed with ureteroscopy. PROCEDURE IN DETAIL: After the patient was identified in the holding and informed consent was obtained.  The patient was brought to the operating room and placed supine on the OR table.  Patient was given ciprofloxacin for IV antibiotics.  SCD boots placed on lower extremity for DVT prophylaxis.  General anesthesia was then administered.  The patient was placed in the dorsal lithotomy position and prepped and draped in the usual sterile fashion.  A surgical time-out was performed.  A 22-French rigid cystoscope was used to enter the bladder. No urethral diverticulum was present. The bladder was inspected in its entirety and there were no lesions noted.  The ureteral orifices were identified in their normal orthotopic positions.  The left ureteral orifice was identified and cannulated with a 6 Fr open ended catheter.  The stone was not visible on scout fluoroscopic guidance.  A retrograde pyelogram was performed with injection of 50/50 Isovue which demonstrated mild hydroureteronephrosis and a UPJ filling defect suggestive of a UPJ stone.  A Sensor wire was passed up to the kidney under fluoroscopic guidance.  The cystoscope was removed and an 8-10 ureteral dilator was used to dilate the distal ureter under fluoroscopic guidance. We inserted a semi-rigid ureteroscope, and no ureteral stones were identified. We advanced a second sensor wire into the left kidney. The semi-rigid ureteroscope was removed and we inspected the ureter on removal and no distal ureteral stone was present on 2nd examination. We removed the semi-rigid ureteroscope and emptied the patient's bladder. An 11/13 Fr 28 cm ureteral access  sheath was passed up to the proximal ureter under fluoroscopic guidance.  An 8 Fr flexible digital ureteroscope was then passed through the access sheath up into the ureter under visual guidance.  The present urolithiasis was encountered in the renal pelvis location.  A 200 thulium laser  fiber was passed through the ureteroscope and laser lithotripsy was commenced at starting settings of 1 J and 10 Hz, and lithotripsy was performed.  Once we were satisfied that the stone was fragmented very small pieces, a 1.9 French zero tip nitinol basket was passed through the ureteroscope.  The residual fragments were basketed out and removed.  The kidney was inspected.  There were no residual fragments of significant size noted.  The ureteroscope was then backed down the ureter under vision.  The ureter was noted to be intact with no ureteral stones.  A 6 x 22 JJ stent without strings was then passed up the wire  under fluoroscopic guidance into the left  kidney with a good curl noted in the kidney and in the bladder under fluoroscopy, however this was too short, so we externalized the 6 x 22 ureteral stent, and advanced a sensor wire into the left renal pelvis under fluoroscopic guidance. We then advanced a 6 x 24 ureteral stent without strings into the left kidney under fluoroscopic guidance with good curls present in the kidney and bladder. The bladder was drained.    The patient was placed back supine, awakened from general anesthesia and brought to recovery room in stable condition.   ESTIMATED BLOOD LOSS: minimal   * No values recorded between 09/21/2024 11:41 AM and 09/21/2024 12:16 PM *   DRAINS:    TOTAL IV FLUIDS: see anesthesia note for details  SPECIMENS:  Order Name Source Comment Collection Info Order Time  PATHOLOGY - GENERAL / OTHER Calculi/Stone  Collected By: Antonina Charleston, MD 09/21/2024 12:09 PM    Release to patient   Immediate          IMPLANTS:  Implant Name Type Inv. Item Serial No. Manufacturer Lot No. LRB No. Used Action  STENT, URETERAL POLARIS ULTRA [id number] - SNA  STENT, URETERAL POLARIS ULTRA [id number] NA [employer] [id number] Left 1 Used, Not Implanted  STENT, URETERAL POLARIS ULTRA [id number] - SN/A  CLEDA URETERAL POLARIS ULTRA [id number] N/A [employer]  [id number] Left 1 Implanted     COMPLICATIONS: None  DISPOSITION: PACU - hemodynamically stable.  ATTESTATION:  FR- Surgery - (Entire alt) - I was present throughout the entire procedure as documented in my operative note (FR)  During the above endoscopic procedure, I was present for the entire viewing portion of the procedure including insertion to removal of the scope.  CHARLESTON ANTONINA, MD

## 2024-09-21 NOTE — Progress Notes (Signed)
 Patient meets discharge criteria. Pt denies SOB, vital signs within patient's baseline. Denies nausea. Skin warm and dry, intact. Surgical site and dressings clean dry, intact. Pain score 6/10 on 0-10 scale, patient verbalized satisfaction with pain level and pain level is tolerable. Pt voided prior discharge. Sedation: RASS score of 0. Patient and family member educated on restrictions, new medications and follow up appointments. Discharge instructions was discussed and printed copy was given to the patient's family. Patient and family member verbalized understanding of post-op care, follow up, diet, and activity. Verbalized understanding of complications and ways to contact physician. PIV removed with tip intact, no hematoma observed. Personal belongings were returned to the patient.  Patient left with assistance of transport on a wheelchair.  Discharge destination: Home with Family.

## 2024-09-21 NOTE — Progress Notes (Signed)
 Anesthesia Transfer of Care Note  Patient: Nichole Fuller  Procedures performed: Cystoscopy, Ureteroscopy, Laser Lithotripsy, Ureteral Stent Placement (Left) UROGRAPHY, RETROGRADE, WITH OR WITHOUT KUB (Left: Ureter)  Report given/handover to: PACU Nurse

## 2024-09-21 NOTE — Interval H&P Note (Signed)
 The H&P has been reviewed and the patient has been examined. There is no change in the overall  assessment and no contraindication for surgery.

## 2024-09-24 ENCOUNTER — Inpatient Hospital Stay
Admission: EM | Admit: 2024-09-24 | Discharge: 2024-09-26 | DRG: 699 | Disposition: A | Discharge status: Home / Self Care | Attending: Student | Admitting: Student

## 2024-09-24 ENCOUNTER — Emergency Department

## 2024-09-24 ENCOUNTER — Encounter: Payer: Self-pay | Admitting: Emergency Medicine

## 2024-09-24 ENCOUNTER — Other Ambulatory Visit: Payer: Self-pay

## 2024-09-24 DIAGNOSIS — Z882 Allergy status to sulfonamides status: Secondary | ICD-10-CM

## 2024-09-24 DIAGNOSIS — Z91040 Latex allergy status: Secondary | ICD-10-CM

## 2024-09-24 DIAGNOSIS — E039 Hypothyroidism, unspecified: Secondary | ICD-10-CM | POA: Diagnosis present

## 2024-09-24 DIAGNOSIS — K219 Gastro-esophageal reflux disease without esophagitis: Secondary | ICD-10-CM | POA: Diagnosis present

## 2024-09-24 DIAGNOSIS — N10 Acute tubulo-interstitial nephritis: Secondary | ICD-10-CM | POA: Diagnosis not present

## 2024-09-24 DIAGNOSIS — T83592A Infection and inflammatory reaction due to indwelling ureteral stent, initial encounter: Secondary | ICD-10-CM | POA: Diagnosis not present

## 2024-09-24 DIAGNOSIS — N12 Tubulo-interstitial nephritis, not specified as acute or chronic: Secondary | ICD-10-CM

## 2024-09-24 DIAGNOSIS — N390 Urinary tract infection, site not specified: Secondary | ICD-10-CM | POA: Diagnosis present

## 2024-09-24 DIAGNOSIS — Z884 Allergy status to anesthetic agent status: Secondary | ICD-10-CM

## 2024-09-24 DIAGNOSIS — Z9884 Bariatric surgery status: Secondary | ICD-10-CM

## 2024-09-24 DIAGNOSIS — Z79899 Other long term (current) drug therapy: Secondary | ICD-10-CM

## 2024-09-24 DIAGNOSIS — E785 Hyperlipidemia, unspecified: Secondary | ICD-10-CM | POA: Diagnosis present

## 2024-09-24 DIAGNOSIS — Z79890 Hormone replacement therapy (postmenopausal): Secondary | ICD-10-CM

## 2024-09-24 DIAGNOSIS — R109 Unspecified abdominal pain: Principal | ICD-10-CM

## 2024-09-24 DIAGNOSIS — M069 Rheumatoid arthritis, unspecified: Secondary | ICD-10-CM | POA: Diagnosis present

## 2024-09-24 DIAGNOSIS — Z888 Allergy status to other drugs, medicaments and biological substances status: Secondary | ICD-10-CM

## 2024-09-24 DIAGNOSIS — Y733 Surgical instruments, materials and gastroenterology and urology devices (including sutures) associated with adverse incidents: Secondary | ICD-10-CM | POA: Diagnosis present

## 2024-09-24 LAB — CBC WITH DIFFERENTIAL/PLATELET
Abs Immature Granulocytes: 0.03 K/uL (ref 0.00–0.07)
Basophils Absolute: 0.1 K/uL (ref 0.0–0.1)
Basophils Relative: 1 %
Eosinophils Absolute: 0.2 K/uL (ref 0.0–0.5)
Eosinophils Relative: 4 %
HCT: 42.6 % (ref 36.0–46.0)
Hemoglobin: 13.9 g/dL (ref 12.0–15.0)
Immature Granulocytes: 1 %
Lymphocytes Relative: 35 %
Lymphs Abs: 2 K/uL (ref 0.7–4.0)
MCH: 28.1 pg (ref 26.0–34.0)
MCHC: 32.6 g/dL (ref 30.0–36.0)
MCV: 86.2 fL (ref 80.0–100.0)
Monocytes Absolute: 0.5 K/uL (ref 0.1–1.0)
Monocytes Relative: 9 %
Neutro Abs: 3 K/uL (ref 1.7–7.7)
Neutrophils Relative %: 50 %
Platelets: 254 K/uL (ref 150–400)
RBC: 4.94 MIL/uL (ref 3.87–5.11)
RDW: 12.8 % (ref 11.5–15.5)
WBC: 5.8 K/uL (ref 4.0–10.5)
nRBC: 0 % (ref 0.0–0.2)

## 2024-09-24 LAB — BASIC METABOLIC PANEL WITH GFR
Anion gap: 10 (ref 5–15)
BUN: 18 mg/dL (ref 6–20)
CO2: 24 mmol/L (ref 22–32)
Calcium: 8.9 mg/dL (ref 8.9–10.3)
Chloride: 107 mmol/L (ref 98–111)
Creatinine, Ser: 0.93 mg/dL (ref 0.44–1.00)
GFR, Estimated: >60 mL/min (ref >60)
Glucose, Bld: 102 mg/dL — ABNORMAL HIGH (ref 70–99)
Potassium: 4 mmol/L (ref 3.5–5.1)
Sodium: 141 mmol/L (ref 135–145)

## 2024-09-24 LAB — URINALYSIS, ROUTINE W REFLEX MICROSCOPIC
Bilirubin Urine: NEGATIVE
Glucose, UA: NEGATIVE mg/dL
Ketones, ur: NEGATIVE mg/dL
Nitrite: POSITIVE — AB
Protein, ur: 100 mg/dL — AB
RBC / HPF: >50 RBC/hpf (ref 0–5)
Specific Gravity, Urine: >1.046 — ABNORMAL HIGH (ref 1.005–1.030)
Squamous Epithelial / HPF: 0 /HPF (ref 0–5)
pH: 5 (ref 5.0–8.0)

## 2024-09-24 LAB — PHOSPHORUS: Phosphorus: 3.9 mg/dL (ref 2.5–4.6)

## 2024-09-24 LAB — MAGNESIUM: Magnesium: 2.3 mg/dL (ref 1.7–2.4)

## 2024-09-24 MED ORDER — POLYETHYLENE GLYCOL 3350 17 G PO PACK: 17.0000 g | PACK | Freq: Every day | ORAL | Status: DC | PRN

## 2024-09-24 MED ORDER — OXYBUTYNIN CHLORIDE 5 MG PO TABS
5.0000 mg | ORAL_TABLET | Freq: Three times a day (TID) | ORAL | Status: DC | PRN
  Administered 2024-09-24 – 2024-09-25 (×2): 5 mg via ORAL
  Filled 2024-09-24 (×2): qty 1

## 2024-09-24 MED ORDER — SUCRALFATE 1 G PO TABS
1.0000 g | ORAL_TABLET | Freq: Three times a day (TID) | ORAL | Status: DC
  Administered 2024-09-24 – 2024-09-26 (×5): 1 g via ORAL
  Filled 2024-09-24 (×6): qty 1

## 2024-09-24 MED ORDER — HYDROMORPHONE HCL 1 MG/ML IJ SOLN
1.0000 mg | INTRAMUSCULAR | Status: DC | PRN
  Administered 2024-09-24: 1 mg via INTRAVENOUS
  Filled 2024-09-24 (×2): qty 1

## 2024-09-24 MED ORDER — BUSPIRONE HCL 10 MG PO TABS
10.0000 mg | ORAL_TABLET | Freq: Two times a day (BID) | ORAL | Status: DC
Start: 2024-09-24 — End: 2024-09-26
  Administered 2024-09-24 – 2024-09-26 (×4): 10 mg via ORAL
  Filled 2024-09-24 (×4): qty 1

## 2024-09-24 MED ORDER — ACETAMINOPHEN 325 MG PO TABS
650.0000 mg | ORAL_TABLET | Freq: Four times a day (QID) | ORAL | Status: DC | PRN
  Administered 2024-09-24 (×2): 650 mg via ORAL
  Filled 2024-09-24 (×2): qty 2

## 2024-09-24 MED ORDER — SODIUM CHLORIDE 0.9 % IV SOLN
2.0000 g | Freq: Once | INTRAVENOUS | Status: AC
  Administered 2024-09-24: 2 g via INTRAVENOUS
  Filled 2024-09-24: qty 12.5

## 2024-09-24 MED ORDER — METHOCARBAMOL 500 MG PO TABS
500.0000 mg | ORAL_TABLET | Freq: Three times a day (TID) | ORAL | Status: DC | PRN
  Administered 2024-09-24: 500 mg via ORAL
  Filled 2024-09-24: qty 1

## 2024-09-24 MED ORDER — PREGABALIN 25 MG PO CAPS
25.0000 mg | ORAL_CAPSULE | Freq: Every morning | ORAL | Status: DC
  Administered 2024-09-25 – 2024-09-26 (×2): 25 mg via ORAL
  Filled 2024-09-24 (×2): qty 1

## 2024-09-24 MED ORDER — ONDANSETRON HCL 4 MG/2ML IJ SOLN
4.0000 mg | Freq: Four times a day (QID) | INTRAMUSCULAR | Status: DC | PRN
  Administered 2024-09-24 – 2024-09-25 (×3): 4 mg via INTRAVENOUS
  Filled 2024-09-24 (×3): qty 2

## 2024-09-24 MED ORDER — DIPHENHYDRAMINE HCL 25 MG PO CAPS
25.0000 mg | ORAL_CAPSULE | Freq: Four times a day (QID) | ORAL | Status: DC | PRN
  Administered 2024-09-24 – 2024-09-26 (×3): 25 mg via ORAL
  Filled 2024-09-24 (×3): qty 1

## 2024-09-24 MED ORDER — SODIUM CHLORIDE 0.9% FLUSH
3.0000 mL | Freq: Two times a day (BID) | INTRAVENOUS | Status: DC
  Administered 2024-09-24 – 2024-09-26 (×5): 3 mL via INTRAVENOUS

## 2024-09-24 MED ORDER — SODIUM CHLORIDE 0.9 % IV SOLN: 250.0000 mL | INTRAVENOUS | Status: AC | PRN

## 2024-09-24 MED ORDER — PANTOPRAZOLE SODIUM 40 MG PO TBEC
40.0000 mg | DELAYED_RELEASE_TABLET | Freq: Every day | ORAL | Status: DC
  Administered 2024-09-24 – 2024-09-26 (×3): 40 mg via ORAL
  Filled 2024-09-24 (×3): qty 1

## 2024-09-24 MED ORDER — PREGABALIN 25 MG PO CAPS: 25.0000 mg | ORAL_CAPSULE | ORAL | Status: DC

## 2024-09-24 MED ORDER — ACETAMINOPHEN 650 MG RE SUPP: 650.0000 mg | Freq: Four times a day (QID) | RECTAL | Status: DC | PRN

## 2024-09-24 MED ORDER — HYDROMORPHONE HCL 1 MG/ML IJ SOLN
1.0000 mg | Freq: Once | INTRAMUSCULAR | Status: AC
  Administered 2024-09-24: 1 mg via INTRAVENOUS
  Filled 2024-09-24: qty 1

## 2024-09-24 MED ORDER — ONDANSETRON HCL 4 MG/2ML IJ SOLN: 4.0000 mg | Freq: Three times a day (TID) | INTRAMUSCULAR | Status: DC | PRN

## 2024-09-24 MED ORDER — LEVOTHYROXINE SODIUM 25 MCG PO TABS
125.0000 ug | ORAL_TABLET | Freq: Every day | ORAL | Status: DC
  Administered 2024-09-25 – 2024-09-26 (×2): 125 ug via ORAL
  Filled 2024-09-24 (×2): qty 1

## 2024-09-24 MED ORDER — LINACLOTIDE 72 MCG PO CAPS
72.0000 ug | ORAL_CAPSULE | Freq: Every morning | ORAL | Status: DC
  Administered 2024-09-25 – 2024-09-26 (×2): 72 ug via ORAL
  Filled 2024-09-24 (×2): qty 1

## 2024-09-24 MED ORDER — BISACODYL 5 MG PO TBEC: 10.0000 mg | DELAYED_RELEASE_TABLET | Freq: Every day | ORAL | Status: DC | PRN

## 2024-09-24 MED ORDER — SODIUM CHLORIDE 0.9% FLUSH
3.0000 mL | Freq: Two times a day (BID) | INTRAVENOUS | Status: DC
  Administered 2024-09-24 – 2024-09-26 (×4): 3 mL via INTRAVENOUS

## 2024-09-24 MED ORDER — MONTELUKAST SODIUM 10 MG PO TABS
10.0000 mg | ORAL_TABLET | Freq: Every day | ORAL | Status: DC
  Administered 2024-09-24 – 2024-09-25 (×2): 10 mg via ORAL
  Filled 2024-09-24 (×2): qty 1

## 2024-09-24 MED ORDER — SODIUM CHLORIDE 0.9 % IV SOLN: INTRAVENOUS | Status: AC

## 2024-09-24 MED ORDER — ATORVASTATIN CALCIUM 20 MG PO TABS
40.0000 mg | ORAL_TABLET | Freq: Every evening | ORAL | Status: DC
  Administered 2024-09-24 – 2024-09-25 (×2): 40 mg via ORAL
  Filled 2024-09-24 (×2): qty 2

## 2024-09-24 MED ORDER — TAMSULOSIN HCL 0.4 MG PO CAPS
0.8000 mg | ORAL_CAPSULE | Freq: Every day | ORAL | Status: DC
  Administered 2024-09-24 – 2024-09-26 (×3): 0.8 mg via ORAL
  Filled 2024-09-24 (×3): qty 2

## 2024-09-24 MED ORDER — IOHEXOL 300 MG/ML  SOLN
100.0000 mL | Freq: Once | INTRAMUSCULAR | Status: AC | PRN
  Administered 2024-09-24: 100 mL via INTRAVENOUS

## 2024-09-24 MED ORDER — ONDANSETRON HCL 4 MG PO TABS: 4.0000 mg | ORAL_TABLET | Freq: Four times a day (QID) | ORAL | Status: DC | PRN

## 2024-09-24 MED ORDER — SODIUM CHLORIDE 0.9 % IV SOLN: 12.5000 mg | Freq: Four times a day (QID) | INTRAVENOUS | Status: DC | PRN

## 2024-09-24 MED ORDER — ONDANSETRON HCL 4 MG/2ML IJ SOLN
4.0000 mg | Freq: Once | INTRAMUSCULAR | Status: AC
  Administered 2024-09-24: 4 mg via INTRAVENOUS
  Filled 2024-09-24: qty 2

## 2024-09-24 MED ORDER — ENOXAPARIN SODIUM 40 MG/0.4ML IJ SOSY
40.0000 mg | PREFILLED_SYRINGE | INTRAMUSCULAR | Status: DC
Start: 2024-09-24 — End: 2024-09-26
  Administered 2024-09-24 – 2024-09-25 (×2): 40 mg via SUBCUTANEOUS
  Filled 2024-09-24 (×2): qty 0.4

## 2024-09-24 MED ORDER — OXYCODONE HCL 5 MG PO TABS
5.0000 mg | ORAL_TABLET | Freq: Four times a day (QID) | ORAL | Status: DC | PRN
Start: 2024-09-24 — End: 2024-09-26
  Administered 2024-09-24 – 2024-09-25 (×4): 5 mg via ORAL
  Filled 2024-09-24 (×6): qty 1

## 2024-09-24 MED ORDER — PREGABALIN 50 MG PO CAPS
50.0000 mg | ORAL_CAPSULE | Freq: Every evening | ORAL | Status: DC
  Administered 2024-09-24 – 2024-09-25 (×2): 50 mg via ORAL
  Filled 2024-09-24 (×2): qty 1

## 2024-09-24 MED ORDER — SODIUM CHLORIDE 0.9 % IV BOLUS
500.0000 mL | Freq: Once | INTRAVENOUS | Status: AC
  Administered 2024-09-24: 500 mL via INTRAVENOUS

## 2024-09-24 MED ORDER — ALBUTEROL SULFATE (2.5 MG/3ML) 0.083% IN NEBU: 2.5000 mg | INHALATION_SOLUTION | RESPIRATORY_TRACT | Status: AC | PRN

## 2024-09-24 MED ORDER — SODIUM CHLORIDE 0.9 % IV SOLN
2.0000 g | INTRAVENOUS | Status: DC
  Administered 2024-09-24 – 2024-09-25 (×2): 2 g via INTRAVENOUS
  Filled 2024-09-24 (×3): qty 20

## 2024-09-24 MED ORDER — HYDROMORPHONE HCL 1 MG/ML IJ SOLN
1.0000 mg | INTRAMUSCULAR | Status: DC | PRN
  Administered 2024-09-24 – 2024-09-26 (×10): 1 mg via INTRAVENOUS
  Filled 2024-09-24 (×10): qty 1

## 2024-09-24 MED ORDER — SODIUM CHLORIDE 0.9% FLUSH: 3.0000 mL | INTRAVENOUS | Status: DC | PRN

## 2024-09-24 NOTE — ED Provider Notes (Signed)
 [facility] Provider Note    Event Date/Time   First MD Initiated Contact with Patient 09/24/24 0203     (approximate)   History   Flank Pain   HPI  Nichole Fuller is a 48 y.o. female with a history of ureteral stones who presents with left flank pain, acute onset around 1:30 AM, radiating to the left lower quadrant, severe intensity.  The patient had a urology procedure and ureteral stent placed a few days ago.  She states that she had been having some pain during urination and some difficulty urinating but no pain like this.  She denies any vomiting.  She has no fever.  I reviewed the past medical records.  The patient had a 2 mm left distal utero stone and a 1 cm left UPJ stone.  She had a laser lithotripsy and left ureteral stent placed by urology at [facility] on 9/23.   Physical Exam   Triage Vital Signs: ED Triage Vitals [09/24/24 0157]  Encounter Vitals Group     BP 112/71     Girls Systolic BP Percentile      Girls Diastolic BP Percentile      Boys Systolic BP Percentile      Boys Diastolic BP Percentile      Pulse Rate (!) 131     Resp (!) 24     Temp      Temp src      SpO2 100 %     Weight      Height      Head Circumference      Peak Flow      Pain Score 10     Pain Loc      Pain Education      Exclude from Growth Chart     Most recent vital signs: Vitals:   09/24/24 0545 09/24/24 0600  BP: 114/62 99/63  Pulse: 83 71  Resp:    Temp:    SpO2: 91% 97%     General: Alert, extremely uncomfortable appearing, moderate distress.  CV:  Good peripheral perfusion.  Resp:  Normal effort.  Abd:  Soft and nontender.  No distention.  Other:  Left flank tenderness.   ED Results / Procedures / Treatments   Labs (all labs ordered are listed, but only abnormal results are displayed) Labs Reviewed  BASIC METABOLIC PANEL WITH GFR - Abnormal; Notable for the following components:      Result Value   Glucose, Bld 102 (*)    All other  components within normal limits  URINALYSIS, ROUTINE W REFLEX MICROSCOPIC - Abnormal; Notable for the following components:   Color, Urine AMBER (*)    APPearance HAZY (*)    Specific Gravity, Urine >1.046 (*)    Hgb urine dipstick LARGE (*)    Protein, ur 100 (*)    Nitrite POSITIVE (*)    Leukocytes,Ua TRACE (*)    Bacteria, UA FEW (*)    All other components within normal limits  CBC WITH DIFFERENTIAL/PLATELET     EKG     RADIOLOGY  CT abdomen/pelvis: I independently viewed and interpreted the images; there is a stent in place with no ureteral stone.  Radiology report indicates the following:  IMPRESSION:  1. Status post left ureteral stone lithotripsy and stent placement with mild  left hydronephrosis and mild left perinephric and periureteric stranding,  possibly residual or postprocedural. Note that superimposed infection could  appear similarly and correlation with urinalysis and  urine culture may be  helpful for further management.    PROCEDURES:  Critical Care performed: No  Procedures   MEDICATIONS ORDERED IN ED: Medications  ceFEPIme  (MAXIPIME ) 2 g in sodium chloride  0.9 % 100 mL IVPB (2 g Intravenous New Bag/Given 09/24/24 0622)  albuterol  (PROVENTIL ) (2.5 MG/3ML) 0.083% nebulizer solution 2.5 mg (has no administration in time range)  HYDROmorphone  (DILAUDID ) injection 1 mg (has no administration in time range)  ondansetron  (ZOFRAN ) injection 4 mg (has no administration in time range)  promethazine (PHENERGAN) 12.5 mg in sodium chloride  0.9 % 50 mL IVPB (has no administration in time range)  HYDROmorphone  (DILAUDID ) injection 1 mg (1 mg Intravenous Given 09/24/24 0213)  sodium chloride  0.9 % bolus 500 mL (500 mLs Intravenous New Bag/Given 09/24/24 0223)  ondansetron  (ZOFRAN ) injection 4 mg (4 mg Intravenous Given 09/24/24 0223)  HYDROmorphone  (DILAUDID ) injection 1 mg (1 mg Intravenous Given 09/24/24 0259)  iohexol  (OMNIPAQUE ) 300 MG/ML solution 100 mL (100  mLs Intravenous Contrast Given 09/24/24 0304)  HYDROmorphone  (DILAUDID ) injection 1 mg (1 mg Intravenous Given 09/24/24 0400)  HYDROmorphone  (DILAUDID ) injection 1 mg (1 mg Intravenous Given 09/24/24 0539)     IMPRESSION / MDM / ASSESSMENT AND PLAN / ED COURSE  I reviewed the triage vital signs and the nursing notes.  48 year old female with PMH as noted above and status post recent laser lithotripsy and ureteral stent presents with severe left flank pain.  Differential diagnosis includes, but is not limited to, ureteral stone, ureteral obstruction, hemorrhage, perforation, other stent complication.  We will obtain lab workup, CT abdomen/pelvis, give analgesia and fluids, and reassess.  Patient's presentation is most consistent with acute presentation with potential threat to life or bodily function.  ----------------------------------------- 6:44 AM on 09/24/2024 -----------------------------------------  CT showed the ureteral stent in place with some perinephric stranding, possibly postprocedural but potentially due to infection.  BMP and CBC show no acute findings, however the urinalysis does show findings including nitrites suggestive of infection.  I ordered IV cefepime  given the recent instrumentation.  The patient has required multiple doses of IV pain medication although is now a bit more comfortable.  Given the severity of her presentation, she will need inpatient admission for further management.  There is no indication for emergent urology consult at this time.  I consulted Dr. Cleatus from the hospitalist service; based on our discussion she agrees to evaluate the patient for admission.   FINAL CLINICAL IMPRESSION(S) / ED DIAGNOSES   Final diagnoses:  Flank pain  Pyelonephritis     Rx / DC Orders   ED Discharge Orders     None        Note:  This document was prepared using Dragon voice recognition software and may include unintentional dictation errors.     Jacolyn Pae, MD 09/24/24 [phone]

## 2024-09-24 NOTE — ED Notes (Signed)
Called lab to add on urine culture ?

## 2024-09-24 NOTE — ED Triage Notes (Addendum)
 Pt arrives POV c/o left flank pain that woke her from sleep around 0130 this morning. Pt reports having ureter stent placed Tuesday at [facility] after having 9mm stone blasted; pt has had pain with urination since procedure but this pain is worse. Pt appears to be in  pain

## 2024-09-24 NOTE — H&P (Signed)
 [facility] History and Physical   Patient: Nichole Fuller FMW:[id number]   PCP: Dwan Ozell BIRCH, DO DOB: [birth year]   DOA: 09/24/2024   DOS: 09/24/2024   DOS: the patient was seen and examined on 09/24/2024  Patient coming from: The patient is coming from Home  Chief Complaint: Left-sided flank pain  HPI: Nichole Fuller is a 48 y.o. female with PMH of gastric bypass, rheumatoid arthritis, HLD, hypothyroid, depression, migraine headache, as reviewed from EMR, recently had left ureteral stent placed at [facility] on 9/23, presented at [facility] ED with complaining of left flank pain started early morning today along with nausea and vomiting.  Patient denied any fever or chills, no chest pain or palpitation, no nasal complaints   ED Course: VS afebrile, HR 131, RR 24, BP 112/73, 100% on RA CBC WNL, no leukocytosis BMP BG 102 slightly elevated, no need for abnormal finding UA positive nitrate, LE trace, bacteria few, RBC >50 WBC 21-50 CT a/p: s/p left ureteral lithotripsy and stent, with mild hydronephrosis, mild perinephric stranding.  TRH was consulted for admission and further management as appropriate   Review of Systems: as mentioned in the history of present illness.  All other systems reviewed and are negative.  Past Medical History:  Diagnosis Date   Hypothyroid    Kidney infection 2024   Kidney stone    years ago   Rheumatoid arthritis(714.0)    Past Surgical History:  Procedure Laterality Date   ABDOMINAL HYSTERECTOMY     APPENDECTOMY     OVARIAN CYST SURGERY     Social History:  reports that she has never smoked. She has never used smokeless tobacco. She reports that she does not drink alcohol and does not use drugs.  Allergies  Allergen Reactions   Bupivacaine Liposome Shortness Of Breath   Bupropion Hives   Latex Hives, Itching, Rash and Swelling   Lisinopril Swelling and Other (See Comments)   Nsaids Other (See Comments)    Cannot have after gastric bypass    Semaglutide Nausea And Vomiting   Prednisone  Other (See Comments)    CAUTION PT HAD RNY GBP   Promethazine Other (See Comments)    Muscle spasms   Sulfa Antibiotics Rash and Other (See Comments)     Family history reviewed and not pertinent History reviewed. No pertinent family history.   Prior to Admission medications   Medication Sig Start Date End Date Taking? Authorizing Provider  atorvastatin  (LIPITOR) 40 MG tablet Take 40 mg by mouth daily. 07/14/23 07/06/25  [provider]  Biotin 89999 MCG TBDP Take 10,000 mcg by mouth daily.    [provider]  busPIRone  (BUSPAR ) 10 MG tablet Take 10 mg by mouth 2 (two) times daily.    [provider]  Calcium  Carb-Cholecalciferol (CALCIUM  1000 + D) 1000-20 MG-MCG TABS Take 1 tablet by mouth at bedtime.    [provider]  cetirizine  (ZYRTEC  ALLERGY) 10 MG tablet Take 1 tablet (10 mg total) by mouth 2 (two) times daily. 03/28/24 04/27/24  Cyrena Mylar, MD  Cholecalciferol 75 MCG (3000 UT) TABS Take 75 mcg by mouth daily. 09/16/22   [provider]  estradiol (ESTRACE) 1 MG tablet  09/07/19   [provider]  FERROUS SULFATE PO Take 65 mg by mouth daily.    [provider]  leflunomide (ARAVA) 10 MG tablet Take 10 mg by mouth daily.    [provider]  levothyroxine  (SYNTHROID ) 125 MCG tablet Take 125 mcg by mouth  daily before breakfast.    [provider]  lidocaine  (XYLOCAINE ) 5 % ointment Apply 1 Application topically daily. To right thigh. 06/11/23   [provider]  LINZESS  72 MCG capsule Take 72 mcg by mouth every morning.    [provider]  methylPREDNISolone  (MEDROL  DOSEPAK) 4 MG TBPK tablet Take according to the package insert. 08/25/24   Bernardino Ditch, NP  montelukast  (SINGULAIR ) 10 MG tablet Take 10 mg by mouth at bedtime. 03/11/19   [provider]  Multiple Vitamins-Minerals (BARIATRIC MULTIVITAMINS/IRON PO) Take 1 tablet by mouth  daily.    [provider]  ondansetron  (ZOFRAN -ODT) 4 MG disintegrating tablet Take 1 tablet (4 mg total) by mouth every 6 (six) hours as needed for nausea or vomiting. 04/24/24   Dicky Anes, MD  oxyCODONE -acetaminophen  (PERCOCET/ROXICET) 5-325 MG tablet Take 1 tablet by mouth every 6 (six) hours as needed for severe pain (pain score 7-10). 04/24/24   Dicky Anes, MD  phentermine (ADIPEX-P) 37.5 MG tablet Take 1 tablet by mouth daily before breakfast. 05/14/23   [provider]  pregabalin  (LYRICA ) 25 MG capsule Take 25 mg by mouth as directed. Take 25mg  in the morning and 75mg  at night. 03/17/23   [provider]  propranolol (INDERAL) 40 MG tablet Take by mouth. 09/07/19   [provider]  topiramate (TOPAMAX) 25 MG tablet Take 25 mg by mouth daily. 05/14/23 05/13/24  [provider]  XELJANZ XR 11 MG TB24 Take 11 mg by mouth daily. 09/02/19   [provider]  Zinc Sulfate (ZINC 15 PO) Take 1 tablet by mouth daily.    [provider]    Physical Exam: Vitals:   09/24/24 0300 09/24/24 0545 09/24/24 0600 09/24/24 0722  BP: 130/69 114/62 99/63   Pulse: 90 83 71   Resp: 18     Temp: 98.9 F (37.2 C)   97.8 F (36.6 C)  TempSrc: Oral     SpO2: 100% 91% 97%     General: alert and oriented to time, place, and person. Appear in mild distress, affect appropriate Eyes: PERRLA, Conjunctiva normal ENT: Oral Mucosa Clear, moist  Neck: no JVD, no Abnormal Mass Or lumps Cardiovascular: S1 and S2 Present, no Murmur, peripheral pulses symmetrical Respiratory: good respiratory effort, Bilateral Air entry equal and Decreased, no signs of accessory muscle use, Clear to Auscultation, no Crackles, no wheezes Abdomen: Bowel Sound present, Soft and Left flank tenderness, Skin: no rashes  Extremities: no Pedal edema, no calf tenderness Neurologic: without any new focal findings Gait not checked due to patient safety concerns  Data Reviewed: I have  personally reviewed and interpreted labs, imaging as discussed below.  CBC: Recent Labs  Lab 09/24/24 0217  WBC 5.8  NEUTROABS 3.0  HGB 13.9  HCT 42.6  MCV 86.2  PLT 254   Basic Metabolic Panel: Recent Labs  Lab 09/24/24 0217  NA 141  K 4.0  CL 107  CO2 24  GLUCOSE 102*  BUN 18  CREATININE 0.93  CALCIUM  8.9   GFR: CrCl cannot be calculated (Unknown ideal weight.). Liver Function Tests: No results for input(s): AST, ALT, ALKPHOS, BILITOT, PROT, ALBUMIN in the last 168 hours. No results for input(s): LIPASE, AMYLASE in the last 168 hours. No results for input(s): AMMONIA in the last 168 hours. Coagulation Profile: No results for input(s): INR, PROTIME in the last 168 hours. Cardiac Enzymes: No results for input(s): CKTOTAL, CKMB, CKMBINDEX, TROPONINI in the last 168 hours. BNP (last 3 results)  No results for input(s): PROBNP in the last 8760 hours. HbA1C: No results for input(s): HGBA1C in the last 72 hours. CBG: No results for input(s): GLUCAP in the last 168 hours. Lipid Profile: No results for input(s): CHOL, HDL, LDLCALC, TRIG, CHOLHDL, LDLDIRECT in the last 72 hours. Thyroid Function Tests: No results for input(s): TSH, T4TOTAL, FREET4, T3FREE, THYROIDAB in the last 72 hours. Anemia Panel: No results for input(s): VITAMINB12, FOLATE, FERRITIN, TIBC, IRON, RETICCTPCT in the last 72 hours. Urine analysis:    Component Value Date/Time   COLORURINE AMBER (A) 09/24/2024 0217   APPEARANCEUR HAZY (A) 09/24/2024 0217   APPEARANCEUR Clear 08/15/2023 0803   LABSPEC >1.046 (H) 09/24/2024 0217   LABSPEC 1.026 12/26/2014 0249   PHURINE 5.0 09/24/2024 0217   GLUCOSEU NEGATIVE 09/24/2024 0217   GLUCOSEU Negative 12/26/2014 0249   HGBUR LARGE (A) 09/24/2024 0217   BILIRUBINUR NEGATIVE 09/24/2024 0217   BILIRUBINUR Negative 08/15/2023 0803   BILIRUBINUR Negative 12/26/2014 0249   KETONESUR NEGATIVE  09/24/2024 0217   PROTEINUR 100 (A) 09/24/2024 0217   UROBILINOGEN 0.2 08/05/2012 0102   NITRITE POSITIVE (A) 09/24/2024 0217   LEUKOCYTESUR TRACE (A) 09/24/2024 0217   LEUKOCYTESUR Negative 12/26/2014 0249    Radiological Exams on Admission: CT ABDOMEN PELVIS W CONTRAST Result Date: 09/24/2024 EXAM: CT ABDOMEN AND PELVIS WITH CONTRAST 09/24/2024 03:11:42 AM TECHNIQUE: CT of the abdomen and pelvis was performed with the administration of intravenous contrast, 100mL of iohexol  (OMNIPAQUE ) 300 MG/ML solution. Multiplanar reformatted images are provided for review. Automated exposure control, iterative reconstruction, and/or weight-based adjustment of the mA/kV was utilized to reduce the radiation dose to as low as reasonably achievable. COMPARISON: Comparison is made to August 11, 2024. CLINICAL HISTORY: Abdominal/flank pain, stone suspected; S/p L ureteral stone lithotripsy and stent 3 days ago, now severe acute pain. Patient arrives with left flank pain that woke her from sleep, reports having ureter stent placed after having a 9mm stone blasted, and has had pain with urination since the procedure but this pain is worse. FINDINGS: LOWER CHEST: No acute abnormality. LIVER: The liver is unremarkable. GALLBLADDER AND BILE DUCTS: Gallbladder is unremarkable. No biliary ductal dilatation. SPLEEN: No acute abnormality. PANCREAS: No acute abnormality. ADRENAL GLANDS: No acute abnormality. KIDNEYS, URETERS AND BLADDER: Previously noted left renal pelvic calculus has been evacuated and a double-J ureteral stent is in place extending from the left renal pelvis to the bladder. Mild left hydronephrosis is present with mild left perinephric and periureteric stranding which may be residual or postprocedural in nature. The right kidney is unremarkable. The bladder is unremarkable. GI AND BOWEL: Surgical changes of gastric bypass and appendectomy are noted. The stomach, small bowel, and large bowel are otherwise  unremarkable. REPRODUCTIVE ORGANS: Status post hysterectomy. No adnexal masses are seen. PERITONEUM AND RETROPERITONEUM: No ascites. No free air. VASCULATURE: Aorta is normal in caliber. LYMPH NODES: No lymphadenopathy. BONES AND SOFT TISSUES: No acute osseous abnormality. No focal soft tissue abnormality. IMPRESSION: 1. Status post left ureteral stone lithotripsy and stent placement with mild left hydronephrosis and mild left perinephric and periureteric stranding, possibly residual or postprocedural. Note that superimposed infection could appear similarly and correlation with urinalysis and urine culture may be helpful for further management. Electronically signed by: Dorethia Molt MD 09/24/2024 04:03 AM EDT RP Workstation: [id number]   EKG: Independently reviewed. sinus tachycardia. A lot of artifact noticed. Patient denies any chest pain  I reviewed all nursing notes, pharmacy notes, vitals, pertinent old records.  Assessment/Plan Principal Problem:  Acute pyelonephritis Active Problems:   Complicated UTI (urinary tract infection)   # Acute left pyelonephritis due to left ureteral stent S/p left lithotripsy and stent insertion done on 9/23 at [facility] Status post cefepime  given in the ED UA positive Started ceftriaxone  2 g IV daily Resumed Flomax , oxybutynin  Follow urine culture Continue prn meds for pain control  # Hypothyroid,: Synthroid  125 mcg. # HLD: Lipitor 40 mg p.o. daily # GERD: Carafate  and pantoprazole  # Rheumatoid arthritis: Held home meds for now due to acute infection.  Resume meds on discharge # Depression: BuSpar  10 mg p.o. twice daily  Nutrition: Regular diet DVT Prophylaxis: Subcutaneous Lovenox   Advance goals of care discussion: Full code   Consults: None   Family Communication: family was not present at bedside, at the time of interview.  Opportunity was given to ask question and all questions were answered satisfactorily.  Disposition: Admitted as  observation, med-surge unit. Likely to be discharged Home, in 1-2 days when stable.  I have discussed plan of care as described above with RN and patient/family.  Severity of Illness: The appropriate patient status for this patient is OBSERVATION. Observation status is judged to be reasonable and necessary in order to provide the required intensity of service to ensure the patient's safety. The patient's presenting symptoms, physical exam findings, and initial radiographic and laboratory data in the context of their medical condition is felt to place them at decreased risk for further clinical deterioration. Furthermore, it is anticipated that the patient will be medically stable for discharge from the hospital within 2 midnights of admission.    Author: ELVAN SOR, MD [facility] 09/24/2024 8:25 AM   To reach On-call, see care teams to locate the attending and reach out to them via [email or web address]. If 7PM-7AM, please contact night-coverage If you still have difficulty reaching the attending provider, please page the [facility] (Director on Call) for [facility] on amion for assistance.

## 2024-09-24 NOTE — ED Notes (Signed)
 Primary RN aware of pt's arrival to room.

## 2024-09-24 NOTE — Plan of Care (Signed)

## 2024-09-24 NOTE — TOC Initial Note (Signed)
 Transition of Care [facility]) - Initial/Assessment Note    Patient Details  Name: Nichole Fuller MRN: [id number] Date of Birth: 1976/06/04  Transition of Care [facility]) CM/SW Contact:    Dalia GORMAN Fuse, RN Phone Number: 09/24/2024, 8:41 AM  Clinical Narrative:                  Patient has a PCP, but no payor on record. Referral sent to Vernell Gully in finance to screen for Medicaid. No other TOC needs, please outreach if needs are identified.       Patient Goals and CMS Choice            Expected Discharge Plan and Services                                              Prior Living Arrangements/Services                       Activities of Daily Living      Permission Sought/Granted                  Emotional Assessment              Admission diagnosis:  Pyelonephritis [N12] Flank pain [R10.9] Complicated UTI (urinary tract infection) [N39.0] Acute pyelonephritis [N10] Patient Active Problem List   Diagnosis Date Noted   Acute pyelonephritis 09/24/2024   Complicated UTI (urinary tract infection) 09/24/2024   Right flank pain 07/07/2023   Migraines 07/07/2023   Hydronephrosis of right kidney 07/07/2023   S/P gastric bypass 09/2022 10/23/2022   Acquired hypothyroidism 07/19/2013   PCP:  Dwan Ozell BIRCH, DO Pharmacy:   [id number] [address],  - [address] [place] [address] Phone: [phone] Fax: [phone]     Social Drivers of Health (SDOH) Social History: SDOH Screenings   Food Insecurity: No Food Insecurity (09/20/2024)   Received from [facility]  Housing: Low Risk  (07/07/2023)  Transportation Needs: No Transportation Needs (09/20/2024)   Received from [facility]  Utilities: Low Risk  (09/20/2024)   Received from [facility]  Financial Resource Strain: Low Risk  (09/20/2024)   Received from [facility]  Tobacco Use: Low Risk  (09/24/2024)  Health  Literacy: Low Risk  (04/08/2021)   Received from [facility]   SDOH Interventions:     Readmission Risk Interventions     No data to display

## 2024-09-25 DIAGNOSIS — E039 Hypothyroidism, unspecified: Secondary | ICD-10-CM | POA: Diagnosis present

## 2024-09-25 DIAGNOSIS — N10 Acute tubulo-interstitial nephritis: Secondary | ICD-10-CM | POA: Diagnosis present

## 2024-09-25 DIAGNOSIS — K219 Gastro-esophageal reflux disease without esophagitis: Secondary | ICD-10-CM | POA: Diagnosis present

## 2024-09-25 DIAGNOSIS — Z9884 Bariatric surgery status: Secondary | ICD-10-CM | POA: Diagnosis not present

## 2024-09-25 DIAGNOSIS — Z884 Allergy status to anesthetic agent status: Secondary | ICD-10-CM | POA: Diagnosis not present

## 2024-09-25 DIAGNOSIS — Z888 Allergy status to other drugs, medicaments and biological substances status: Secondary | ICD-10-CM | POA: Diagnosis not present

## 2024-09-25 DIAGNOSIS — R109 Unspecified abdominal pain: Secondary | ICD-10-CM | POA: Diagnosis present

## 2024-09-25 DIAGNOSIS — T83592A Infection and inflammatory reaction due to indwelling ureteral stent, initial encounter: Secondary | ICD-10-CM | POA: Diagnosis present

## 2024-09-25 DIAGNOSIS — Z91040 Latex allergy status: Secondary | ICD-10-CM | POA: Diagnosis not present

## 2024-09-25 DIAGNOSIS — M069 Rheumatoid arthritis, unspecified: Secondary | ICD-10-CM | POA: Diagnosis present

## 2024-09-25 DIAGNOSIS — E785 Hyperlipidemia, unspecified: Secondary | ICD-10-CM | POA: Diagnosis present

## 2024-09-25 DIAGNOSIS — Z79890 Hormone replacement therapy (postmenopausal): Secondary | ICD-10-CM | POA: Diagnosis not present

## 2024-09-25 DIAGNOSIS — Z882 Allergy status to sulfonamides status: Secondary | ICD-10-CM | POA: Diagnosis not present

## 2024-09-25 DIAGNOSIS — Z79899 Other long term (current) drug therapy: Secondary | ICD-10-CM | POA: Diagnosis not present

## 2024-09-25 DIAGNOSIS — Y733 Surgical instruments, materials and gastroenterology and urology devices (including sutures) associated with adverse incidents: Secondary | ICD-10-CM | POA: Diagnosis present

## 2024-09-25 LAB — PHOSPHORUS: Phosphorus: 4.2 mg/dL (ref 2.5–4.6)

## 2024-09-25 LAB — BASIC METABOLIC PANEL WITH GFR
Anion gap: 9 (ref 5–15)
BUN: 11 mg/dL (ref 6–20)
CO2: 24 mmol/L (ref 22–32)
Calcium: 8.1 mg/dL — ABNORMAL LOW (ref 8.9–10.3)
Chloride: 110 mmol/L (ref 98–111)
Creatinine, Ser: 0.83 mg/dL (ref 0.44–1.00)
GFR, Estimated: >60 mL/min (ref >60)
Glucose, Bld: 100 mg/dL — ABNORMAL HIGH (ref 70–99)
Potassium: 4.2 mmol/L (ref 3.5–5.1)
Sodium: 143 mmol/L (ref 135–145)

## 2024-09-25 LAB — CBC
HCT: 34.4 % — ABNORMAL LOW (ref 36.0–46.0)
Hemoglobin: 11.4 g/dL — ABNORMAL LOW (ref 12.0–15.0)
MCH: 28.5 pg (ref 26.0–34.0)
MCHC: 33.1 g/dL (ref 30.0–36.0)
MCV: 86 fL (ref 80.0–100.0)
Platelets: 200 K/uL (ref 150–400)
RBC: 4 MIL/uL (ref 3.87–5.11)
RDW: 13 % (ref 11.5–15.5)
WBC: 4.5 K/uL (ref 4.0–10.5)
nRBC: 0 % (ref 0.0–0.2)

## 2024-09-25 LAB — URINE CULTURE: Culture: <10000 — AB

## 2024-09-25 LAB — MAGNESIUM: Magnesium: 2.1 mg/dL (ref 1.7–2.4)

## 2024-09-25 NOTE — Progress Notes (Signed)
 [facility] Progress Note  Patient: Nichole Fuller    FMW:[id number]  DOA: 09/24/2024     Date of Service: the patient was seen and examined on 09/25/2024  Chief Complaint  Patient presents with   Flank Pain   Brief hospital course: Nichole Fuller is a 48 y.o. female with PMH of gastric bypass, rheumatoid arthritis, HLD, hypothyroid, depression, migraine headache, as reviewed from EMR, recently had left ureteral stent placed at [facility] on 9/23, presented at [facility] ED with complaining of left flank pain started early morning today along with nausea and vomiting.  Patient denied any fever or chills, no chest pain or palpitation, no nasal complaints     ED Course: VS afebrile, HR 131, RR 24, BP 112/73, 100% on RA CBC WNL, no leukocytosis BMP BG 102 slightly elevated, no need for abnormal finding UA positive nitrate, LE trace, bacteria few, RBC >50 WBC 21-50 CT a/p: s/p left ureteral lithotripsy and stent, with mild hydronephrosis, mild perinephric stranding.   TRH was consulted for admission and further management as appropriate   Assessment and Plan:    # Acute left pyelonephritis due to left ureteral stent S/p left lithotripsy and stent insertion done on 9/23 at [facility] Status post cefepime  given in the ED UA positive Started ceftriaxone  2 g IV daily Resumed Flomax , oxybutynin  Follow urine culture Continue prn meds for pain control   # Hypothyroid,: Synthroid  125 mcg. # HLD: Lipitor 40 mg p.o. daily # GERD: Carafate  and pantoprazole  # Rheumatoid arthritis: Held home meds for now due to acute infection.  Resume meds on discharge # Depression: BuSpar  10 mg p.o. twice daily     Body mass index is 30.58 kg/m.  Interventions:  Diet: Regular diet DVT Prophylaxis: Subcutaneous Lovenox    Advance goals of care discussion: Full code  Family Communication: family was not present at bedside, at the time of interview.  The pt provided permission to discuss medical plan with the  family. Opportunity was given to ask question and all questions were answered satisfactorily.   Disposition:  Pt is from home, admitted with pyelonephritis status post left ureteral stent, still has abdominal pain, which precludes a safe discharge. Discharge to home, when stable, may need 1-2 more days to stabilize.  Subjective: No significant events overnight, patient is still having off-and-on abdominal pain on the left flank area and also bladder spasms.  In the morning time pain was 8/10, slightly improved after pain medication.  Patient denied any chest pain or palpitation, no shortness of breath, no any other complaints.  Physical Exam: General: NAD, lying comfortably Appear in no distress, affect appropriate Eyes: PERRLA ENT: Oral Mucosa Clear, moist  Neck: no JVD,  Cardiovascular: S1 and S2 Present, no Murmur,  Respiratory: good respiratory effort, Bilateral Air entry equal and Decreased, no Crackles, no wheezes Abdomen: BS positive, Soft and left flank tenderness,  Skin: no rashes Extremities: no Pedal edema, no calf tenderness Neurologic: without any new focal findings Gait not checked due to patient safety concerns  Vitals:   09/24/24 2014 09/25/24 0353 09/25/24 0733 09/25/24 1546  BP: (!) 104/58 113/72 112/73 119/72  Pulse: 63 82 62 72  Resp: 16  17 18   Temp: 98 F (36.7 C) 98.2 F (36.8 C) 98.7 F (37.1 C) 97.6 F (36.4 C)  TempSrc:   Oral   SpO2: 97% 96% 98% 100%  Weight:      Height:        Intake/Output Summary (Last 24 hours) at  09/25/2024 1601 Last data filed at 09/25/2024 1300 Gross per 24 hour  Intake 1841.74 ml  Output --  Net 1841.74 ml   Filed Weights   09/24/24 0826  Weight: 73.4 kg    Data Reviewed: I have personally reviewed and interpreted daily labs, tele strips, imagings as discussed above. I reviewed all nursing notes, pharmacy notes, vitals, pertinent old records I have discussed plan of care as described above with RN and  patient/family.  CBC: Recent Labs  Lab 09/24/24 0217 09/25/24 0519  WBC 5.8 4.5  NEUTROABS 3.0  --   HGB 13.9 11.4*  HCT 42.6 34.4*  MCV 86.2 86.0  PLT 254 200   Basic Metabolic Panel: Recent Labs  Lab 09/24/24 0217 09/24/24 0921 09/25/24 0519  NA 141  --  143  K 4.0  --  4.2  CL 107  --  110  CO2 24  --  24  GLUCOSE 102*  --  100*  BUN 18  --  11  CREATININE 0.93  --  0.83  CALCIUM  8.9  --  8.1*  MG  --  2.3 2.1  PHOS  --  3.9 4.2    Studies: No results found.  Scheduled Meds:  atorvastatin   40 mg Oral QPM   busPIRone   10 mg Oral BID   enoxaparin  (LOVENOX ) injection  40 mg Subcutaneous Q24H   levothyroxine   125 mcg Oral QAC breakfast   linaclotide   72 mcg Oral q morning   montelukast   10 mg Oral QHS   pantoprazole   40 mg Oral Daily   pregabalin   25 mg Oral q morning   And   pregabalin   50 mg Oral QPM   sodium chloride  flush  3 mL Intravenous Q12H   sodium chloride  flush  3 mL Intravenous Q12H   sucralfate   1 g Oral TID   tamsulosin   0.8 mg Oral Daily   Continuous Infusions:  cefTRIAXone  (ROCEPHIN )  IV 2 g (09/25/24 1405)   promethazine (PHENERGAN) injection (IM or IVPB)     PRN Meds: acetaminophen  **OR** acetaminophen , bisacodyl , diphenhydrAMINE , HYDROmorphone  (DILAUDID ) injection, methocarbamol , ondansetron  **OR** ondansetron  (ZOFRAN ) IV, oxybutynin , oxyCODONE , polyethylene glycol, promethazine (PHENERGAN) injection (IM or IVPB), sodium chloride  flush  Time spent: 35 minutes  Author: ELVAN SOR. MD [facility] 09/25/2024 4:01 PM  To reach On-call, see care teams to locate the attending and reach out to them via [email or web address]. If 7PM-7AM, please contact night-coverage If you still have difficulty reaching the attending provider, please page the [facility] (Director on Call) for [facility] on amion for assistance.

## 2024-09-25 NOTE — Plan of Care (Signed)
  Problem: Clinical Measurements: Goal: Ability to maintain clinical measurements within normal limits will improve Outcome: Progressing   Problem: Activity: Goal: Risk for activity intolerance will decrease Outcome: Progressing   Problem: Elimination: Goal: Will not experience complications related to bowel motility Outcome: Progressing   Problem: Coping: Goal: Level of anxiety will decrease Outcome: Progressing   Problem: Pain Managment: Goal: General experience of comfort will improve and/or be controlled Outcome: Progressing   Problem: Safety: Goal: Ability to remain free from injury will improve Outcome: Progressing

## 2024-09-25 NOTE — Plan of Care (Signed)
  Problem: Education: Goal: Knowledge of General Education information will improve Description: Including pain rating scale, medication(s)/side effects and non-pharmacologic comfort measures Outcome: Progressing   Problem: Health Behavior/Discharge Planning: Goal: Ability to manage health-related needs will improve Outcome: Progressing   Problem: Clinical Measurements: Goal: Ability to maintain clinical measurements within normal limits will improve Outcome: Progressing Goal: Will remain free from infection Outcome: Progressing Goal: Diagnostic test results will improve Outcome: Progressing Goal: Respiratory complications will improve Outcome: Progressing Goal: Cardiovascular complication will be avoided Outcome: Progressing   Problem: Activity: Goal: Risk for activity intolerance will decrease Outcome: Progressing   Problem: Elimination: Goal: Will not experience complications related to bowel motility Outcome: Progressing Goal: Will not experience complications related to urinary retention Outcome: Progressing   Problem: Pain Managment: Goal: General experience of comfort will improve and/or be controlled Outcome: Progressing   Problem: Pain Managment: Goal: General experience of comfort will improve and/or be controlled Outcome: Progressing   Problem: Skin Integrity: Goal: Risk for impaired skin integrity will decrease Outcome: Progressing

## 2024-09-26 DIAGNOSIS — N10 Acute tubulo-interstitial nephritis: Secondary | ICD-10-CM | POA: Diagnosis not present

## 2024-09-26 LAB — PHOSPHORUS: Phosphorus: 3 mg/dL (ref 2.5–4.6)

## 2024-09-26 LAB — CBC
HCT: 35.4 % — ABNORMAL LOW (ref 36.0–46.0)
Hemoglobin: 11.7 g/dL — ABNORMAL LOW (ref 12.0–15.0)
MCH: 28.2 pg (ref 26.0–34.0)
MCHC: 33.1 g/dL (ref 30.0–36.0)
MCV: 85.3 fL (ref 80.0–100.0)
Platelets: 203 K/uL (ref 150–400)
RBC: 4.15 MIL/uL (ref 3.87–5.11)
RDW: 12.7 % (ref 11.5–15.5)
WBC: 3.6 K/uL — ABNORMAL LOW (ref 4.0–10.5)
nRBC: 0 % (ref 0.0–0.2)

## 2024-09-26 LAB — BASIC METABOLIC PANEL WITH GFR
Anion gap: 9 (ref 5–15)
BUN: 11 mg/dL (ref 6–20)
CO2: 25 mmol/L (ref 22–32)
Calcium: 8.3 mg/dL — ABNORMAL LOW (ref 8.9–10.3)
Chloride: 108 mmol/L (ref 98–111)
Creatinine, Ser: 0.73 mg/dL (ref 0.44–1.00)
GFR, Estimated: >60 mL/min (ref >60)
Glucose, Bld: 89 mg/dL (ref 70–99)
Potassium: 3.7 mmol/L (ref 3.5–5.1)
Sodium: 142 mmol/L (ref 135–145)

## 2024-09-26 LAB — MAGNESIUM: Magnesium: 2.2 mg/dL (ref 1.7–2.4)

## 2024-09-26 MED ORDER — OXYCODONE HCL 5 MG PO TABS: 5.0000 mg | ORAL_TABLET | Freq: Three times a day (TID) | ORAL | 0 refills | Status: AC | PRN

## 2024-09-26 MED ORDER — AMOXICILLIN-POT CLAVULANATE 875-125 MG PO TABS: 1.0000 | ORAL_TABLET | Freq: Two times a day (BID) | ORAL | 0 refills | Status: AC

## 2024-09-26 MED ORDER — OXYBUTYNIN CHLORIDE 5 MG PO TABS: 5.0000 mg | ORAL_TABLET | Freq: Three times a day (TID) | ORAL | 0 refills | Status: AC | PRN

## 2024-09-26 MED ORDER — ACETAMINOPHEN 325 MG PO TABS: 650.0000 mg | ORAL_TABLET | Freq: Four times a day (QID) | ORAL | Status: AC | PRN

## 2024-09-26 NOTE — Progress Notes (Signed)
 Patient is alert and oriented X 4. All questions are answered via phone. No any questions at this time. Peripheral I/v removed.

## 2024-09-26 NOTE — Discharge Summary (Signed)
 [facility] Discharge Summary   Patient: Nichole Fuller FMW:[id number]  PCP: Dwan Ozell BIRCH, DO  Date of admission: 09/24/2024   Date of discharge:  09/26/2024     Discharge Diagnoses:  Principal Problem:   Acute pyelonephritis Active Problems:   Complicated UTI (urinary tract infection)   Admitted From: Home Disposition:  Home   Recommendations for Outpatient Follow-up:  Follow-up with PCP in 1 week Follow-up with urology as per schedule, as per patient she has an appointment with urology tomorrow a.m. for removal of ureteral stent. Follow up LABS/TEST:     Follow-up Information     Zimmerman, Michael D, DO Follow up in 1 week(s).   Specialty: Family Medicine Contact information: [address] [place] [address] [phone]                Diet recommendation: Regular diet  Activity: The patient is advised to gradually reintroduce usual activities, as tolerated  Discharge Condition: stable  Code Status: Full code   History of present illness: As per the H and P dictated on admission.  Hospital Course:  Nichole Fuller is a 48 y.o. female with PMH of gastric bypass, rheumatoid arthritis, HLD, hypothyroid, depression, migraine headache, as reviewed from EMR, recently had left ureteral stent placed at [facility] on 9/23, presented at [facility] ED with complaining of left flank pain started early morning today along with nausea and vomiting.  Patient denied any fever or chills, no chest pain or palpitation, no nasal complaints   ED Course: VS afebrile, HR 131, RR 24, BP 112/73, 100% on RA CBC WNL, no leukocytosis BMP BG 102 slightly elevated, no need for abnormal finding UA positive nitrate, LE trace, bacteria few, RBC >50 WBC 21-50 CT a/p: s/p left ureteral lithotripsy and stent, with mild hydronephrosis, mild perinephric stranding.   [facility] was consulted for admission and further management as appropriate     Assessment and Plan:    # Acute left  pyelonephritis due to left ureteral stent S/p left lithotripsy and stent insertion done on 9/23 at [facility] Status post cefepime  given in the ED UA positive. S/p ceftriaxone  2 g IV daily Resumed Flomax , oxybutynin  urine culture <10k, insufficient growth.  S/p as needed pain meds.  Patient is feeling improvement, still has notable pain but a lot better now, patient feels comfortable going home now.  Transition to oral Augmentin twice daily for 3 days.  As per patient she has an appointment with urology tomorrow to remove ureteral stent.     # Hypothyroid,: Synthroid  125 mcg. # HLD: Lipitor 40 mg p.o. daily # GERD: Carafate  and pantoprazole  # Rheumatoid arthritis: Held home meds for now due to acute infection.  Resumed meds on discharge # Depression: BuSpar  10 mg p.o. twice daily    Body mass index is 30.58 kg/m.  Nutrition Interventions:   Pain control  - [place]  Controlled Substance Reporting System database could not be reviewed, as website was not working. - Oxycodone  5 mg p.o. 3 times daily prn x 5 days,  15 tablets prescribed.  - Patient was instructed, not to drive, operate heavy machinery, perform activities at heights, swimming or participation in water activities or provide baby sitting services while on Pain, Sleep and Anxiety Medications; until her outpatient Physician has advised to do so again.  - Also recommended to not to take more than prescribed Pain, Sleep and Anxiety Medications.  Patient was ambulatory without any assistance. On the day of the discharge the patient's  vitals were stable, and no other acute medical condition were reported by patient. the patient was felt safe to be discharge at Home.  Consultants: None Procedures: None  Discharge Exam: General: Appear in no distress, Rash; Oral Mucosa Clear, moist. Cardiovascular: S1 and S2 Present, no Murmur, Respiratory: normal respiratory effort, Bilateral Air entry present and no Crackles, no  wheezes Abdomen: Bowel Sound present, Soft and mid left flank tenderness. Extremities: no Pedal edema, no calf tenderness Neurology: alert and oriented to time, place, and person affect appropriate.  Filed Weights   09/24/24 0826  Weight: 73.4 kg   Vitals:   09/26/24 0351 09/26/24 0732  BP: 132/75 116/79  Pulse: 62 60  Resp:  16  Temp: 97.8 F (36.6 C) 98.2 F (36.8 C)  SpO2: 94% 98%    DISCHARGE MEDICATION: Allergies as of 09/26/2024       Reactions   Bupivacaine Liposome Shortness Of Breath   Bupropion Hives   Latex Hives, Itching, Rash, Swelling   Lisinopril Swelling, Other (See Comments)   Nsaids Other (See Comments)   Cannot have after gastric bypass   Semaglutide Nausea And Vomiting   Prednisone  Other (See Comments)   CAUTION PT HAD RNY GBP   Promethazine Other (See Comments)   Muscle spasms   Sulfa Antibiotics Rash, Other (See Comments)        Medication List     STOP taking these medications    Biotin 89999 MCG Tbdp   Calcium  1000 + D 1000-20 MG-MCG Tabs Generic drug: Calcium  Carb-Cholecalciferol   Cholecalciferol 75 MCG (3000 UT) Tabs   ciprofloxacin 500 MG tablet Commonly known as: CIPRO   lidocaine  5 % ointment Commonly known as: XYLOCAINE    methylPREDNISolone  4 MG Tbpk tablet Commonly known as: MEDROL  DOSEPAK   oxyCODONE -acetaminophen  5-325 MG tablet Commonly known as: PERCOCET/ROXICET   phentermine 37.5 MG tablet Commonly known as: ADIPEX-P   propranolol 40 MG tablet Commonly known as: INDERAL   valACYclovir 1000 MG tablet Commonly known as: VALTREX   ZINC 15 PO       TAKE these medications    acetaminophen  325 MG tablet Commonly known as: TYLENOL  Take 2 tablets (650 mg total) by mouth every 6 (six) hours as needed for up to 7 days for mild pain (pain score 1-3), fever or headache. What changed:  how much to take reasons to take this   albuterol  108 (90 Base) MCG/ACT inhaler Commonly known as: VENTOLIN  HFA Inhale 2  puffs into the lungs every 6 (six) hours as needed for wheezing.   amoxicillin-clavulanate 875-125 MG tablet Commonly known as: AUGMENTIN Take 1 tablet by mouth 2 (two) times daily for 3 days.   atorvastatin  40 MG tablet Commonly known as: LIPITOR Take 40 mg by mouth daily.   BARIATRIC MULTIVITAMINS/IRON PO Take 1 tablet by mouth daily.   busPIRone  10 MG tablet Commonly known as: BUSPAR  Take 10 mg by mouth 2 (two) times daily.   cetirizine  10 MG tablet Commonly known as: ZyrTEC  Allergy Take 1 tablet (10 mg total) by mouth 2 (two) times daily.   estradiol 1 MG tablet Commonly known as: ESTRACE   FERROUS SULFATE PO Take 65 mg by mouth daily.   leflunomide 10 MG tablet Commonly known as: ARAVA Take 10 mg by mouth daily.   levothyroxine  125 MCG tablet Commonly known as: SYNTHROID  Take 125 mcg by mouth daily before breakfast.   Linzess  72 MCG capsule Generic drug: linaclotide  Take 72 mcg by mouth every morning.  methocarbamol  500 MG tablet Commonly known as: ROBAXIN  Take 500 mg by mouth 3 (three) times daily.   montelukast  10 MG tablet Commonly known as: SINGULAIR  Take 10 mg by mouth at bedtime.   ondansetron  4 MG disintegrating tablet Commonly known as: ZOFRAN -ODT Take 1 tablet (4 mg total) by mouth every 6 (six) hours as needed for nausea or vomiting.   oxybutynin  5 MG tablet Commonly known as: DITROPAN  Take 1 tablet (5 mg total) by mouth every 8 (eight) hours as needed for up to 5 days for bladder spasms.   oxyCODONE  5 MG immediate release tablet Commonly known as: Oxy IR/ROXICODONE  Take 1 tablet (5 mg total) by mouth every 8 (eight) hours as needed for up to 5 days for severe pain (pain score 7-10). What changed:  when to take this reasons to take this   pantoprazole  40 MG tablet Commonly known as: PROTONIX  Take 40 mg by mouth daily.   pregabalin  25 MG capsule Commonly known as: LYRICA  Take 25 mg by mouth as directed. Take 25mg  in the morning and  75mg  at night.   solifenacin 5 MG tablet Commonly known as: VESICARE Take 5 mg by mouth daily.   sucralfate  1 g tablet Commonly known as: CARAFATE  Take 1 g by mouth 3 (three) times daily.   tamsulosin  0.4 MG Caps capsule Commonly known as: FLOMAX  Take 0.8 mg by mouth daily. What changed: Another medication with the same name was removed. Continue taking this medication, and follow the directions you see here.   topiramate 25 MG tablet Commonly known as: TOPAMAX Take 25 mg by mouth daily.   Xeljanz XR 11 MG Tb24 Generic drug: Tofacitinib Citrate ER Take 11 mg by mouth daily.       Allergies  Allergen Reactions   Bupivacaine Liposome Shortness Of Breath   Bupropion Hives   Latex Hives, Itching, Rash and Swelling   Lisinopril Swelling and Other (See Comments)   Nsaids Other (See Comments)    Cannot have after gastric bypass   Semaglutide Nausea And Vomiting   Prednisone  Other (See Comments)    CAUTION PT HAD RNY GBP   Promethazine Other (See Comments)    Muscle spasms   Sulfa Antibiotics Rash and Other (See Comments)   Discharge Instructions     Call MD for:  difficulty breathing, headache or visual disturbances   Complete by: As directed    Call MD for:  extreme fatigue   Complete by: As directed    Call MD for:  persistant dizziness or light-headedness   Complete by: As directed    Call MD for:  persistant nausea and vomiting   Complete by: As directed    Call MD for:  severe uncontrolled pain   Complete by: As directed    Call MD for:  temperature >100.4   Complete by: As directed    Diet general   Complete by: As directed    Discharge instructions   Complete by: As directed    Follow-up with PCP in 1 week Follow-up with urology as per schedule, as per patient she has an appointment with urology tomorrow a.m. for removal of ureteral stent.   Increase activity slowly   Complete by: As directed        The results of significant diagnostics from this  hospitalization (including imaging, microbiology, ancillary and laboratory) are listed below for reference.    Significant Diagnostic Studies: CT ABDOMEN PELVIS W CONTRAST Result Date: 09/24/2024 EXAM: CT ABDOMEN AND PELVIS WITH  CONTRAST 09/24/2024 03:11:42 AM TECHNIQUE: CT of the abdomen and pelvis was performed with the administration of intravenous contrast, 100mL of iohexol  (OMNIPAQUE ) 300 MG/ML solution. Multiplanar reformatted images are provided for review. Automated exposure control, iterative reconstruction, and/or weight-based adjustment of the mA/kV was utilized to reduce the radiation dose to as low as reasonably achievable. COMPARISON: Comparison is made to August 11, 2024. CLINICAL HISTORY: Abdominal/flank pain, stone suspected; S/p L ureteral stone lithotripsy and stent 3 days ago, now severe acute pain. Patient arrives with left flank pain that woke her from sleep, reports having ureter stent placed after having a 9mm stone blasted, and has had pain with urination since the procedure but this pain is worse. FINDINGS: LOWER CHEST: No acute abnormality. LIVER: The liver is unremarkable. GALLBLADDER AND BILE DUCTS: Gallbladder is unremarkable. No biliary ductal dilatation. SPLEEN: No acute abnormality. PANCREAS: No acute abnormality. ADRENAL GLANDS: No acute abnormality. KIDNEYS, URETERS AND BLADDER: Previously noted left renal pelvic calculus has been evacuated and a double-J ureteral stent is in place extending from the left renal pelvis to the bladder. Mild left hydronephrosis is present with mild left perinephric and periureteric stranding which may be residual or postprocedural in nature. The right kidney is unremarkable. The bladder is unremarkable. GI AND BOWEL: Surgical changes of gastric bypass and appendectomy are noted. The stomach, small bowel, and large bowel are otherwise unremarkable. REPRODUCTIVE ORGANS: Status post hysterectomy. No adnexal masses are seen. PERITONEUM AND  RETROPERITONEUM: No ascites. No free air. VASCULATURE: Aorta is normal in caliber. LYMPH NODES: No lymphadenopathy. BONES AND SOFT TISSUES: No acute osseous abnormality. No focal soft tissue abnormality. IMPRESSION: 1. Status post left ureteral stone lithotripsy and stent placement with mild left hydronephrosis and mild left perinephric and periureteric stranding, possibly residual or postprocedural. Note that superimposed infection could appear similarly and correlation with urinalysis and urine culture may be helpful for further management. Electronically signed by: Dorethia Molt MD 09/24/2024 04:03 AM EDT RP Workstation: [id number]    Microbiology: Recent Results (from the past 240 hours)  Urine Culture (for pregnant, neutropenic or urologic patients or patients with an indwelling urinary catheter)     Status: Abnormal   Collection Time: 09/24/24  2:17 AM   Specimen: Urine, Clean Catch  Result Value Ref Range Status   Specimen Description   Final    URINE, CLEAN CATCH Performed at [facility], [address], [place], [place] [address]    Special Requests   Final    NONE Performed at [facility], [address], [place], [place] [address]    Culture (A)  Final    <10,000 COLONIES/mL INSIGNIFICANT GROWTH Performed at [facility], [address], [place], [place] [address]    Report Status 09/25/2024 FINAL  Final  Culture, blood (Routine X 2) w Reflex to ID Panel     Status: None (Preliminary result)   Collection Time: 09/24/24  9:21 AM   Specimen: BLOOD  Result Value Ref Range Status   Specimen Description BLOOD BLOOD LEFT FOREARM  Final   Special Requests   Final    BOTTLES DRAWN AEROBIC ONLY Blood Culture adequate volume   Culture   Final    NO GROWTH 2 DAYS Performed at [facility], [address], [place], [place] [address]    Report Status PENDING  Incomplete  Culture, blood (Routine X 2) w Reflex to ID Panel     Status: None  (Preliminary result)   Collection Time: 09/24/24  9:21 AM   Specimen: BLOOD  Result Value Ref Range Status   Specimen Description BLOOD BLOOD LEFT ARM  Final   Special Requests   Final    BOTTLES DRAWN AEROBIC ONLY Blood Culture adequate volume   Culture   Final    NO GROWTH 2 DAYS Performed at [facility], [address] [place], [place] [address]    Report Status PENDING  Incomplete     Labs: CBC: Recent Labs  Lab 09/24/24 0217 09/25/24 0519 09/26/24 0310  WBC 5.8 4.5 3.6*  NEUTROABS 3.0  --   --   HGB 13.9 11.4* 11.7*  HCT 42.6 34.4* 35.4*  MCV 86.2 86.0 85.3  PLT 254 200 203   Basic Metabolic Panel: Recent Labs  Lab 09/24/24 0217 09/24/24 0921 09/25/24 0519 09/26/24 0310  NA 141  --  143 142  K 4.0  --  4.2 3.7  CL 107  --  110 108  CO2 24  --  24 25  GLUCOSE 102*  --  100* 89  BUN 18  --  11 11  CREATININE 0.93  --  0.83 0.73  CALCIUM  8.9  --  8.1* 8.3*  MG  --  2.3 2.1 2.2  PHOS  --  3.9 4.2 3.0   Liver Function Tests: No results for input(s): AST, ALT, ALKPHOS, BILITOT, PROT, ALBUMIN in the last 168 hours. No results for input(s): LIPASE, AMYLASE in the last 168 hours. No results for input(s): AMMONIA in the last 168 hours. Cardiac Enzymes: No results for input(s): CKTOTAL, CKMB, CKMBINDEX, TROPONINI in the last 168 hours. BNP (last 3 results) No results for input(s): BNP in the last 8760 hours. CBG: No results for input(s): GLUCAP in the last 168 hours.  Time spent: 35 minutes  Signed:  Elvan Sor  [facility] 09/26/2024 1:36 PM

## 2024-09-29 LAB — CULTURE, BLOOD (ROUTINE X 2)
Culture: NO GROWTH
Culture: NO GROWTH
Special Requests: ADEQUATE
Special Requests: ADEQUATE

## 2024-10-29 ENCOUNTER — Other Ambulatory Visit: Payer: Self-pay

## 2024-10-29 ENCOUNTER — Emergency Department
Admission: EM | Admit: 2024-10-29 | Discharge: 2024-10-30 | Discharge status: Against Medical Advice | Attending: Emergency Medicine | Admitting: Emergency Medicine

## 2024-10-29 DIAGNOSIS — R112 Nausea with vomiting, unspecified: Secondary | ICD-10-CM | POA: Insufficient documentation

## 2024-10-29 DIAGNOSIS — Z5321 Procedure and treatment not carried out due to patient leaving prior to being seen by health care provider: Secondary | ICD-10-CM | POA: Insufficient documentation

## 2024-10-29 DIAGNOSIS — R1010 Upper abdominal pain, unspecified: Secondary | ICD-10-CM | POA: Diagnosis not present

## 2024-10-29 LAB — URINALYSIS, ROUTINE W REFLEX MICROSCOPIC
Bilirubin Urine: NEGATIVE
Glucose, UA: NEGATIVE mg/dL
Hgb urine dipstick: NEGATIVE
Ketones, ur: 5 mg/dL — AB
Leukocytes,Ua: NEGATIVE
Nitrite: NEGATIVE
Protein, ur: NEGATIVE mg/dL
Specific Gravity, Urine: 1.026 (ref 1.005–1.030)
pH: 5 (ref 5.0–8.0)

## 2024-10-29 LAB — COMPREHENSIVE METABOLIC PANEL WITH GFR
ALT: 119 U/L — ABNORMAL HIGH (ref 0–44)
AST: 160 U/L — ABNORMAL HIGH (ref 15–41)
Albumin: 4.3 g/dL (ref 3.5–5.0)
Alkaline Phosphatase: 63 U/L (ref 38–126)
Anion gap: 13 (ref 5–15)
BUN: 23 mg/dL — ABNORMAL HIGH (ref 6–20)
CO2: 21 mmol/L — ABNORMAL LOW (ref 22–32)
Calcium: 9.3 mg/dL (ref 8.9–10.3)
Chloride: 105 mmol/L (ref 98–111)
Creatinine, Ser: 0.86 mg/dL (ref 0.44–1.00)
GFR, Estimated: >60 mL/min (ref >60)
Glucose, Bld: 134 mg/dL — ABNORMAL HIGH (ref 70–99)
Potassium: 4.1 mmol/L (ref 3.5–5.1)
Sodium: 139 mmol/L (ref 135–145)
Total Bilirubin: 0.7 mg/dL (ref 0.0–1.2)
Total Protein: 7.7 g/dL (ref 6.5–8.1)

## 2024-10-29 LAB — CBC
HCT: 44.1 % (ref 36.0–46.0)
Hemoglobin: 14.8 g/dL (ref 12.0–15.0)
MCH: 28.7 pg (ref 26.0–34.0)
MCHC: 33.6 g/dL (ref 30.0–36.0)
MCV: 85.5 fL (ref 80.0–100.0)
Platelets: 274 K/uL (ref 150–400)
RBC: 5.16 MIL/uL — ABNORMAL HIGH (ref 3.87–5.11)
RDW: 13.5 % (ref 11.5–15.5)
WBC: 8 K/uL (ref 4.0–10.5)
nRBC: 0 % (ref 0.0–0.2)

## 2024-10-29 LAB — LIPASE, BLOOD: Lipase: 30 U/L (ref 11–51)

## 2024-10-29 MED ORDER — ONDANSETRON 4 MG PO TBDP
4.0000 mg | ORAL_TABLET | Freq: Once | ORAL | Status: AC | PRN
Start: 2024-10-29 — End: 2024-10-29
  Administered 2024-10-29: 4 mg via ORAL
  Filled 2024-10-29: qty 1

## 2024-10-29 NOTE — ED Triage Notes (Signed)
 Pt states she started having nausea 3 hours ago. Reports several episodes of emesis. Reports severe upper abdominal pain radiating around to her back.

## 2025-01-13 ENCOUNTER — Ambulatory Visit: Admitting: Nurse Practitioner
